# Patient Record
Sex: Female | Born: 1985 | Race: Black or African American | Hispanic: No | Marital: Single | Smoking: Never smoker
Health system: Southern US, Community
[De-identification: ages and names within clinical notes are randomized; demographics above are authoritative.]

## PROBLEM LIST (undated history)

## (undated) DIAGNOSIS — J45909 Unspecified asthma, uncomplicated: Secondary | ICD-10-CM

## (undated) DIAGNOSIS — E119 Type 2 diabetes mellitus without complications: Secondary | ICD-10-CM

## (undated) DIAGNOSIS — N390 Urinary tract infection, site not specified: Secondary | ICD-10-CM

## (undated) DIAGNOSIS — D649 Anemia, unspecified: Secondary | ICD-10-CM

## (undated) HISTORY — DX: Urinary tract infection, site not specified: N39.0

## (undated) HISTORY — DX: Type 2 diabetes mellitus without complications: E11.9

## (undated) HISTORY — PX: MOUTH SURGERY: SHX715

## (undated) HISTORY — DX: Unspecified asthma, uncomplicated: J45.909

## (undated) HISTORY — PX: TONSILLECTOMY: SUR1361

---

## 2013-07-22 HISTORY — PX: BREAST BIOPSY: SHX20

## 2017-10-09 ENCOUNTER — Encounter: Payer: Self-pay | Admitting: Nurse Practitioner

## 2018-04-22 ENCOUNTER — Ambulatory Visit (INDEPENDENT_AMBULATORY_CARE_PROVIDER_SITE_OTHER): Payer: PRIVATE HEALTH INSURANCE | Admitting: Nurse Practitioner

## 2018-04-22 ENCOUNTER — Encounter: Payer: Self-pay | Admitting: Nurse Practitioner

## 2018-04-22 VITALS — BP 126/90 | HR 88 | Temp 98.2°F | Ht 65.25 in | Wt 173.6 lb

## 2018-04-22 DIAGNOSIS — D250 Submucous leiomyoma of uterus: Secondary | ICD-10-CM

## 2018-04-22 DIAGNOSIS — R1904 Left lower quadrant abdominal swelling, mass and lump: Secondary | ICD-10-CM

## 2018-04-22 DIAGNOSIS — E119 Type 2 diabetes mellitus without complications: Secondary | ICD-10-CM | POA: Diagnosis not present

## 2018-04-22 DIAGNOSIS — D251 Intramural leiomyoma of uterus: Secondary | ICD-10-CM | POA: Diagnosis not present

## 2018-04-22 DIAGNOSIS — N6320 Unspecified lump in the left breast, unspecified quadrant: Secondary | ICD-10-CM

## 2018-04-22 DIAGNOSIS — N926 Irregular menstruation, unspecified: Secondary | ICD-10-CM

## 2018-04-22 LAB — COMPREHENSIVE METABOLIC PANEL
ALK PHOS: 39 U/L (ref 39–117)
ALT: 9 U/L (ref 0–35)
AST: 12 U/L (ref 0–37)
Albumin: 4.2 g/dL (ref 3.5–5.2)
BUN: 10 mg/dL (ref 6–23)
CO2: 25 meq/L (ref 19–32)
Calcium: 9.6 mg/dL (ref 8.4–10.5)
Chloride: 103 mEq/L (ref 96–112)
Creatinine, Ser: 0.64 mg/dL (ref 0.40–1.20)
GFR: 139.19 mL/min (ref >60.00)
GLUCOSE: 188 mg/dL — AB (ref 70–99)
POTASSIUM: 4 meq/L (ref 3.5–5.1)
SODIUM: 136 meq/L (ref 135–145)
TOTAL PROTEIN: 8 g/dL (ref 6.0–8.3)
Total Bilirubin: 0.4 mg/dL (ref 0.2–1.2)

## 2018-04-22 LAB — CBC
HCT: 39.8 % (ref 36.0–46.0)
Hemoglobin: 13 g/dL (ref 12.0–15.0)
MCHC: 32.7 g/dL (ref 30.0–36.0)
MCV: 74.3 fl — ABNORMAL LOW (ref 78.0–100.0)
PLATELETS: 270 10*3/uL (ref 150.0–400.0)
RBC: 5.36 Mil/uL — ABNORMAL HIGH (ref 3.87–5.11)
RDW: 15 % (ref 11.5–15.5)
WBC: 4.9 10*3/uL (ref 4.0–10.5)

## 2018-04-22 LAB — TSH: TSH: 1.46 u[IU]/mL (ref 0.35–4.50)

## 2018-04-22 LAB — MICROALBUMIN / CREATININE URINE RATIO
CREATININE, U: 38.5 mg/dL
Microalb Creat Ratio: 1.8 mg/g (ref 0.0–30.0)

## 2018-04-22 LAB — HEMOGLOBIN A1C: HEMOGLOBIN A1C: 9.2 % — AB (ref 4.6–6.5)

## 2018-04-22 LAB — POCT URINE PREGNANCY: Preg Test, Ur: NEGATIVE

## 2018-04-22 NOTE — Progress Notes (Signed)
Subjective:  Patient ID: Tricia Benitez, female    DOB: 1986-04-08  Age: 32 y.o. MRN: [id number]  CC: Establish Care (est care/lumps on abd, discomfort, going on 1 year/)  No PCP since 2009, lost insurance coverage No previous GYN: hx of abnormal pap  Abdominal Pain  This is a chronic problem. The current episode started more than 1 year ago. The onset quality is gradual. The problem occurs constantly. The problem has been gradually worsening. The pain is located in the suprapubic region, LLQ and RLQ. The quality of the pain is aching, dull and a sensation of fullness. The abdominal pain does not radiate. Pertinent negatives include no anorexia, arthralgias, belching, constipation, diarrhea, dysuria, fever, flatus, frequency, hematochezia, hematuria, melena, myalgias, nausea, vomiting or weight loss. The pain is aggravated by palpation and certain positions. The pain is relieved by nothing. She has tried nothing for the symptoms. There is no history of abdominal surgery, colon cancer, Crohn's disease, gallstones, GERD, irritable bowel syndrome, pancreatitis, PUD or ulcerative colitis.  reports irregular menstrual cycle, but not heavy. FHx of uterine fibroids (mother) Sexually active with female partner only.  DM: Diagnosed 2009. Unable to tolerate metformin, so has not been taking. Has made changes to diet (low carb) Does not remember last hgbA1c Home glucose:160-180 (fasting).  Reviewed past Medical, Social and Family history today.  No outpatient medications prior to visit.   No facility-administered medications prior to visit.    ROS See HPI  Objective:  BP 126/90   Pulse 88   Temp 98.2 F (36.8 C) (Oral)   Ht 5' 5.25" (1.657 m)   Wt 173 lb 9.6 oz (78.7 kg)   LMP 03/23/2018 (Within Days)   SpO2 100%   BMI 28.67 kg/m   BP Readings from Last 3 Encounters:  04/22/18 126/90    Wt Readings from Last 3 Encounters:  04/22/18 173 lb 9.6 oz (78.7 kg)    Physical Exam    Constitutional: She is oriented to person, place, and time. She appears well-developed and well-nourished.  Neck: Normal range of motion. Neck supple. No thyromegaly present.  Cardiovascular: Normal rate.  Pulmonary/Chest: Effort normal.  Abdominal: She exhibits distension and mass. There is no tenderness. There is no rebound and no guarding. No hernia.    Musculoskeletal: Normal range of motion. She exhibits no edema.  Neurological: She is alert and oriented to person, place, and time.  Psychiatric: She has a normal mood and affect. Her behavior is normal. Thought content normal.  Vitals reviewed.   Lab Results  Component Value Date   WBC 4.9 04/22/2018   HGB 13.0 04/22/2018   HCT 39.8 04/22/2018   PLT 270.0 04/22/2018   GLUCOSE 188 (H) 04/22/2018   ALT 9 04/22/2018   AST 12 04/22/2018   NA 136 04/22/2018   K 4.0 04/22/2018   CL 103 04/22/2018   CREATININE 0.64 04/22/2018   BUN 10 04/22/2018   CO2 25 04/22/2018   TSH 1.46 04/22/2018   HGBA1C 9.2 (H) 04/22/2018   MICROALBUR <0.7 04/22/2018     Assessment & Plan:   Aniesha was seen today for establish care.  Diagnoses and all orders for this visit:  Intramural and submucous leiomyoma of uterus -     CT Abdomen Pelvis W Contrast; Future -     Ambulatory referral to Gynecology  Abdominal mass, left lower quadrant  Type 2 diabetes mellitus without complication, without long-term current use of insulin (HCC) -     Hemoglobin A1c -  Comprehensive metabolic panel -     Microalbumin / creatinine urine ratio -     metFORMIN (GLUCOPHAGE-XR) 750 MG 24 hr tablet; Take 1 tablet (750 mg total) by mouth daily with breakfast. -     sitaGLIPtin (JANUVIA) 50 MG tablet; Take 1 tablet (50 mg total) by mouth daily.  Irregular menstrual cycle -     TSH -     POCT urine pregnancy -     CBC -     Ambulatory referral to Gynecology  Left breast mass   I am having Tricia Benitez start on metFORMIN and sitaGLIPtin.  Meds  ordered this encounter  Medications  . metFORMIN (GLUCOPHAGE-XR) 750 MG 24 hr tablet    Sig: Take 1 tablet (750 mg total) by mouth daily with breakfast.    Dispense:  90 tablet    Refill:  1    Order Specific Question:   Supervising Provider    Answer:   Lucille Passy [[id number]]  . sitaGLIPtin (JANUVIA) 50 MG tablet    Sig: Take 1 tablet (50 mg total) by mouth daily.    Dispense:  30 tablet    Refill:  5    Order Specific Question:   Supervising Provider    Answer:   Lucille Passy [[id number]]    Follow-up: Return in about 3 months (around 07/23/2018) for CPE (fasting).  Wilfred Lacy, NP

## 2018-04-22 NOTE — Patient Instructions (Addendum)
Notified Tricia Benitez about lab results and CT ABD/pelvis report: Uncontrolled DM with HgbA1c of 9.2 and normal urine microalbumin. Metformin and januvia sent. New rx sent. CBC indicates mild anemia, may use prenatal vitamin once a day. Normal Thyroid function. CT ABD/pelvis indicates large uterine fibroid. Urgent referral to GYN entered. CT also found left breast mass, and recommended for additional imaging. Tricia Benitez stated she had breast biopsy done 2015 and pathology was benign. She performs frequent self breast exam and has not noticed a change.

## 2018-04-23 ENCOUNTER — Ambulatory Visit (INDEPENDENT_AMBULATORY_CARE_PROVIDER_SITE_OTHER)
Admission: RE | Admit: 2018-04-23 | Discharge: 2018-04-23 | Disposition: A | Discharge status: Home / Self Care | Payer: PRIVATE HEALTH INSURANCE | Source: Ambulatory Visit | Attending: Nurse Practitioner | Admitting: Nurse Practitioner

## 2018-04-23 ENCOUNTER — Ambulatory Visit: Payer: Self-pay | Admitting: Primary Care

## 2018-04-23 ENCOUNTER — Encounter: Payer: Self-pay | Admitting: Nurse Practitioner

## 2018-04-23 DIAGNOSIS — R1900 Intra-abdominal and pelvic swelling, mass and lump, unspecified site: Secondary | ICD-10-CM

## 2018-04-23 DIAGNOSIS — N6320 Unspecified lump in the left breast, unspecified quadrant: Secondary | ICD-10-CM | POA: Insufficient documentation

## 2018-04-23 DIAGNOSIS — D250 Submucous leiomyoma of uterus: Secondary | ICD-10-CM | POA: Insufficient documentation

## 2018-04-23 DIAGNOSIS — D251 Intramural leiomyoma of uterus: Principal | ICD-10-CM

## 2018-04-23 MED ORDER — METFORMIN HCL ER 750 MG PO TB24: 750.0000 mg | ORAL_TABLET | Freq: Every day | ORAL | 1 refills | Status: DC

## 2018-04-23 MED ORDER — IOPAMIDOL (ISOVUE-300) INJECTION 61%
100.0000 mL | Freq: Once | INTRAVENOUS | Status: AC | PRN
  Administered 2018-04-23: 100 mL via INTRAVENOUS

## 2018-04-23 MED ORDER — SITAGLIPTIN PHOSPHATE 50 MG PO TABS: 50.0000 mg | ORAL_TABLET | Freq: Every day | ORAL | 5 refills | Status: DC

## 2018-04-30 ENCOUNTER — Inpatient Hospital Stay: Admission: RE | Admit: 2018-04-30 | Payer: PRIVATE HEALTH INSURANCE | Source: Ambulatory Visit

## 2018-05-04 ENCOUNTER — Encounter: Payer: Self-pay | Admitting: Nurse Practitioner

## 2018-05-04 ENCOUNTER — Telehealth: Payer: Self-pay | Admitting: Nurse Practitioner

## 2018-05-04 DIAGNOSIS — E559 Vitamin D deficiency, unspecified: Secondary | ICD-10-CM | POA: Insufficient documentation

## 2018-05-04 MED ORDER — VITAMIN D (ERGOCALCIFEROL) 1.25 MG (50000 UNIT) PO CAPS: 50000.0000 [IU] | ORAL_CAPSULE | ORAL | 0 refills | Status: DC

## 2018-05-04 NOTE — Telephone Encounter (Signed)
Pt is aware.  

## 2018-05-04 NOTE — Telephone Encounter (Signed)
Reviewed labs done by [facility].10/09/2017. Vitamin D level of 19.7. Sent 50000IU supplement x 6weeks then switch to 1000IU OTC once a day.

## 2018-05-06 ENCOUNTER — Other Ambulatory Visit (HOSPITAL_COMMUNITY)
Admission: RE | Admit: 2018-05-06 | Discharge: 2018-05-06 | Disposition: A | Discharge status: Home / Self Care | Payer: No Typology Code available for payment source | Source: Ambulatory Visit | Attending: Obstetrics and Gynecology | Admitting: Obstetrics and Gynecology

## 2018-05-06 ENCOUNTER — Encounter: Payer: Self-pay | Admitting: Obstetrics and Gynecology

## 2018-05-06 ENCOUNTER — Ambulatory Visit (INDEPENDENT_AMBULATORY_CARE_PROVIDER_SITE_OTHER): Payer: PRIVATE HEALTH INSURANCE | Admitting: Obstetrics and Gynecology

## 2018-05-06 VITALS — BP 136/90 | HR 83 | Ht 65.0 in | Wt 179.0 lb

## 2018-05-06 DIAGNOSIS — N938 Other specified abnormal uterine and vaginal bleeding: Secondary | ICD-10-CM | POA: Insufficient documentation

## 2018-05-06 DIAGNOSIS — D250 Submucous leiomyoma of uterus: Secondary | ICD-10-CM

## 2018-05-06 DIAGNOSIS — Z1151 Encounter for screening for human papillomavirus (HPV): Secondary | ICD-10-CM

## 2018-05-06 DIAGNOSIS — Z124 Encounter for screening for malignant neoplasm of cervix: Secondary | ICD-10-CM

## 2018-05-06 DIAGNOSIS — D251 Intramural leiomyoma of uterus: Secondary | ICD-10-CM

## 2018-05-06 NOTE — Patient Instructions (Signed)
Myomectomy Myomectomy is surgery to remove a noncancerous tumor (myoma) from the uterus. Myomas are tumors made up of fibrous tissue. They are often called fibroid tumors. Fibroid tumors can range from the size of a pea to the size of a grapefruit. In a myomectomy, the fibroid tumor is removed without removing the uterus. Because these tumors are rarely cancerous, this surgery is usually done only if the tumor is growing or causing symptoms such as pain, pressure, bleeding, or pain with intercourse. LET YOUR HEALTH CARE PROVIDER KNOW ABOUT:  Any allergies you have.  All medicines you are taking, including vitamins, herbs, eye drops, creams, and over-the-counter medicines.  Previous problems you or members of your family have had with the use of anesthetics.  Any blood disorders you have.  Previous surgeries you have had.  Medical conditions you have. RISKS AND COMPLICATIONS Generally, this is a safe procedure. However, as with any procedure, complications can occur. Possible complications include:  Excessive bleeding.  Infection.  Injury to nearby organs.  Blood clots in the legs, chest, or brain.  Scar tissue on other organs and in the pelvis. This may require another surgery to remove the scar tissue.  BEFORE THE PROCEDURE  Ask your health care provider about changing or stopping your regular medicines. Avoid taking aspirin or blood thinners as directed by your health care provider.  Do not  eat or drink anything after midnight on the night before surgery.  If you smoke, do not  smoke for 2 weeks before the surgery.  Do not  drink alcohol the day before the surgery.  Arrange for someone to drive you home after the procedure or after your hospital stay. Also arrange for someone to help you with activities during your recovery. PROCEDURE You will be given medicine to make you sleep through the procedure (general anesthetic). Any of the following methods may be used to perform  a myomectomy:  Small monitors will be put on your body. They are used to check your heart, blood pressure, and oxygen level.  An IV access tube will be put into one of your veins. Medicine will be able to flow directly into your body through this IV tube.  You might be given a medicine to help you relax (sedative).  You will be given a medicine to make you sleep (general anesthetic). A breathing tube will be placed into your lungs during the procedure.  A thin, flexible tube (catheter) will be inserted into your bladder to collect urine.  Any of the following methods may be used to perform a myomectomy: ? Hysteroscopic myomectomy-This method may be used when the fibroid tumor is inside the cavity of the uterus. A long, thin tube that is like a telescope (hysteroscope) is inserted inside the uterus. A saline solution is put into your uterus. This expands the uterus and allows the surgeon to see the fibroids. Tools are passed through the hysteroscope to remove the fibroid tumor in pieces. ? Laparoscopic myomectomy-A few small cuts (incisions) are made in the lower abdomen. A thin, lighted tube with a tiny camera on the end (laparoscope) is inserted through one of the incisions. This gives the surgeon a good view of the area. The fibroid tumor is removed through the other incisions. The incisions are then closed with stitches (sutures) or staples. ? Abdominal myomectomy-This method is used when the fibroid tumor cannot be removed with a hysteroscope or laparoscope. The surgery is performed through a larger surgical incision in the abdomen. The   fibroid tumor is removed through this incision. The incision is closed with sutures or staples.  What to expect after the procedure  If you had a laparoscopic or hysteroscopic myomectomy, you may be able to go home the same day, or you may need to stay in the hospital overnight.  If you had an abdominal myomectomy, you may need to stay in the hospital for a  few days.  Your IV access tube and catheter will be removed in 1-2 days.  You may be given medicine for pain or to help you sleep.  You may be given an antibiotic medicine, if needed. This information is not intended to replace advice given to you by your health care provider. Make sure you discuss any questions you have with your health care provider. Document Released: 05/05/2007 Document Revised: 12/14/2015 Document Reviewed: 02/17/2013 Elsevier Interactive Patient Education  2017 [employer]. Hysterectomy Information A hysterectomy is a surgery to remove your uterus. After surgery, you will no longer have periods. Also, you will not be able to get pregnant. Reasons for this surgery  You have bleeding that is not normal and keeps coming back.  You have lasting (chronic) lower belly (pelvic) pain.  You have a lasting infection.  The lining of your uterus grows outside your uterus.  The lining of your uterus grows in the muscle of your uterus.  Your uterus falls down into your vagina.  You have a growth in your uterus that causes problems.  You have cells that could turn into cancer (precancerous cells).  You have cancer of the uterus or cervix. Types There are 3 types of hysterectomies. Depending on the type, the surgery will:  Remove the top part of the uterus only.  Remove the uterus and the cervix.  Remove the uterus, cervix, and tissue that holds the uterus in place in the lower belly.  Ways a hysterectomy can be performed There are 5 ways this surgery can be performed.  A cut (incision) is made in the belly (abdomen). The uterus is taken out through the cut.  A cut is made in the vagina. The uterus is taken out through the cut.  Three or four cuts are made in the belly. A surgical device with a camera is put through one of the cuts. The uterus is cut into small pieces. The uterus is taken out through the cuts or the vagina.  Three or four cuts are made in the  belly. A surgical device with a camera is put through one of the cuts. The uterus is taken out through the vagina.  Three or four cuts are made in the belly. A surgical device that is controlled by a computer makes a visual image. The device helps the surgeon control the surgical tools. The uterus is cut into small pieces. The pieces are taken out through the cuts or through the vagina.  What can I expect after the surgery?  You will be given pain medicine.  You will need help at home for 3-5 days after surgery.  You will need to see your doctor in 2-4 weeks after surgery.  You may get hot flashes, have night sweats, and have trouble sleeping.  You may need to have Pap tests in the future if your surgery was related to cancer. Talk to your doctor. It is still good to have regular exams. This information is not intended to replace advice given to you by your health care provider. Make sure you discuss any questions  you have with your health care provider. Document Released: 09/30/2011 Document Revised: 12/14/2015 Document Reviewed: 03/15/2013 Elsevier Interactive Patient Education  2018 [employer]. Endometrial Biopsy, Care After This sheet gives you information about how to care for yourself after your procedure. Your health care provider may also give you more specific instructions. If you have problems or questions, contact your health care provider. What can I expect after the procedure? After the procedure, it is common to have:  Mild cramping.  A small amount of vaginal bleeding for a few days. This is normal.  Follow these instructions at home:  Take over-the-counter and prescription medicines only as told by your health care provider.  Do not douche, use tampons, or have sexual intercourse until your health care provider approves.  Return to your normal activities as told by your health care provider. Ask your health care provider what activities are safe for you.  Follow  instructions from your health care provider about any activity restrictions, such as restrictions on strenuous exercise or heavy lifting. Contact a health care provider if:  You have heavy bleeding, or bleed for longer than 2 days after the procedure.  You have bad smelling discharge from your vagina.  You have a fever or chills.  You have a burning sensation when urinating or you have difficulty urinating.  You have severe pain in your lower abdomen. Get help right away if:  You have severe cramps in your stomach or back.  You pass large blood clots.  Your bleeding increases.  You become weak or light-headed, or you pass out. Summary  After the procedure, it is common to have mild cramping and a small amount of vaginal bleeding for a few days.  Do not douche, use tampons, or have sexual intercourse until your health care provider approves.  Return to your normal activities as told by your health care provider. Ask your health care provider what activities are safe for you. This information is not intended to replace advice given to you by your health care provider. Make sure you discuss any questions you have with your health care provider. Document Released: 04/28/2013 Document Revised: 07/24/2016 Document Reviewed: 07/24/2016 Elsevier Interactive Patient Education  2017 [employer].

## 2018-05-06 NOTE — Progress Notes (Signed)
Ms Roskelley presents for evaluation of uterine fibroids noted on CT scan earlier this week.  CT scan note 3 large fibroids 9, 10, and 14 cm Pt with pain last several weeks Irregular cycles last 6 months, prior regular Cycles last 5 days, heavy with cramps and clots Pap smear several years ago abnormal SAB x 1 DM Same sex relationship H/O bengin breast Bx in the past  PE AF VSS Lungs clear Heart RRR Abd soft pelvic/ abd mass effect noted irregular contour, large mass RUQ and LLQ GU nl EGBUS cervix no lesions pap smear obtained, uterus as noted above, unable to evaluate adnexal d/t uterine size  ENDOMETRIAL BIOPSY     The indications for endometrial biopsy were reviewed.   Risks of the biopsy including cramping, bleeding, infection, uterine perforation, inadequate specimen and need for additional procedures  were discussed. The patient states she understands and agrees to undergo procedure today. Consent was signed. Time out was performed. Urine HCG was negative. During the pelvic exam, the cervix was prepped with Betadine. A single-toothed tenaculum was placed on the anterior lip of the cervix to stabilize it. The 3 mm pipelle was introduced into the endometrial cavity without difficulty to a depth of 12 cm, and a moderate amount of tissue was obtained and sent to pathology. The instruments were removed from the patient's vagina. Minimal bleeding from the cervix was noted. The patient tolerated the procedure well. Routine post-procedure instructions were given to the patient.    A/P DUB        Enlarged uterus with uterine fibroids        Breast mass  Pt reports PCP following breast mass. Encourage to complete f/u. EMBX completed today Uterine fibroids reviewed with pt. Tx options discussed. Pt leaning to hyst. Information on hyst and myomectomy provided to pt. Will contact pt with test results and proceed from there

## 2018-05-08 LAB — CYTOLOGY - PAP
DIAGNOSIS: NEGATIVE
HPV (WINDOPATH): NOT DETECTED

## 2018-07-07 ENCOUNTER — Other Ambulatory Visit: Payer: Self-pay

## 2018-07-07 DIAGNOSIS — D251 Intramural leiomyoma of uterus: Principal | ICD-10-CM

## 2018-07-07 DIAGNOSIS — D250 Submucous leiomyoma of uterus: Secondary | ICD-10-CM

## 2018-07-08 ENCOUNTER — Encounter (HOSPITAL_COMMUNITY): Payer: Self-pay

## 2018-07-14 ENCOUNTER — Ambulatory Visit (HOSPITAL_COMMUNITY)
Admission: RE | Admit: 2018-07-14 | Discharge: 2018-07-14 | Disposition: A | Discharge status: Home / Self Care | Payer: No Typology Code available for payment source | Source: Ambulatory Visit | Attending: Obstetrics and Gynecology | Admitting: Obstetrics and Gynecology

## 2018-07-14 DIAGNOSIS — N852 Hypertrophy of uterus: Secondary | ICD-10-CM | POA: Insufficient documentation

## 2018-07-14 DIAGNOSIS — D251 Intramural leiomyoma of uterus: Secondary | ICD-10-CM | POA: Insufficient documentation

## 2018-07-14 DIAGNOSIS — D250 Submucous leiomyoma of uterus: Secondary | ICD-10-CM

## 2018-07-21 ENCOUNTER — Telehealth: Payer: Self-pay

## 2018-07-21 NOTE — Telephone Encounter (Signed)
Contacted pt and advised scheduler will call for appt to discuss surgery, pt agreed.

## 2018-07-24 ENCOUNTER — Ambulatory Visit (INDEPENDENT_AMBULATORY_CARE_PROVIDER_SITE_OTHER): Payer: PRIVATE HEALTH INSURANCE | Admitting: Nurse Practitioner

## 2018-07-24 ENCOUNTER — Encounter: Payer: Self-pay | Admitting: Nurse Practitioner

## 2018-07-24 VITALS — BP 138/94 | HR 92 | Temp 98.8°F | Ht 65.0 in | Wt 173.8 lb

## 2018-07-24 DIAGNOSIS — Z114 Encounter for screening for human immunodeficiency virus [HIV]: Secondary | ICD-10-CM | POA: Diagnosis not present

## 2018-07-24 DIAGNOSIS — Z13220 Encounter for screening for lipoid disorders: Secondary | ICD-10-CM | POA: Diagnosis not present

## 2018-07-24 DIAGNOSIS — E119 Type 2 diabetes mellitus without complications: Secondary | ICD-10-CM

## 2018-07-24 DIAGNOSIS — Z0001 Encounter for general adult medical examination with abnormal findings: Secondary | ICD-10-CM | POA: Diagnosis not present

## 2018-07-24 DIAGNOSIS — N6320 Unspecified lump in the left breast, unspecified quadrant: Secondary | ICD-10-CM | POA: Diagnosis not present

## 2018-07-24 DIAGNOSIS — E559 Vitamin D deficiency, unspecified: Secondary | ICD-10-CM | POA: Diagnosis not present

## 2018-07-24 DIAGNOSIS — D500 Iron deficiency anemia secondary to blood loss (chronic): Secondary | ICD-10-CM | POA: Diagnosis not present

## 2018-07-24 DIAGNOSIS — Z136 Encounter for screening for cardiovascular disorders: Secondary | ICD-10-CM | POA: Diagnosis not present

## 2018-07-24 LAB — LIPID PANEL
Cholesterol: 130 mg/dL (ref 0–200)
HDL: 39.2 mg/dL (ref >39.00)
LDL Cholesterol: 78 mg/dL (ref 0–99)
NONHDL: 91.1
Total CHOL/HDL Ratio: 3
Triglycerides: 67 mg/dL (ref 0.0–149.0)
VLDL: 13.4 mg/dL (ref 0.0–40.0)

## 2018-07-24 LAB — CBC WITH DIFFERENTIAL/PLATELET
BASOS ABS: 0 10*3/uL (ref 0.0–0.1)
Basophils Relative: 0.9 % (ref 0.0–3.0)
EOS ABS: 0 10*3/uL (ref 0.0–0.7)
Eosinophils Relative: 1 % (ref 0.0–5.0)
HCT: 40.1 % (ref 36.0–46.0)
Hemoglobin: 12.7 g/dL (ref 12.0–15.0)
LYMPHS ABS: 1.7 10*3/uL (ref 0.7–4.0)
Lymphocytes Relative: 34.1 % (ref 12.0–46.0)
MCHC: 31.7 g/dL (ref 30.0–36.0)
MCV: 74.7 fl — AB (ref 78.0–100.0)
MONOS PCT: 6.9 % (ref 3.0–12.0)
Monocytes Absolute: 0.3 10*3/uL (ref 0.1–1.0)
NEUTROS PCT: 57.1 % (ref 43.0–77.0)
Neutro Abs: 2.8 10*3/uL (ref 1.4–7.7)
Platelets: 304 10*3/uL (ref 150.0–400.0)
RBC: 5.36 Mil/uL — AB (ref 3.87–5.11)
RDW: 15.8 % — ABNORMAL HIGH (ref 11.5–15.5)
WBC: 4.9 10*3/uL (ref 4.0–10.5)

## 2018-07-24 LAB — IBC PANEL
Iron: 46 ug/dL (ref 42–145)
Saturation Ratios: 12.8 % — ABNORMAL LOW (ref 20.0–50.0)
TRANSFERRIN: 256 mg/dL (ref 212.0–360.0)

## 2018-07-24 LAB — BASIC METABOLIC PANEL
BUN: 11 mg/dL (ref 6–23)
CHLORIDE: 103 meq/L (ref 96–112)
CO2: 27 meq/L (ref 19–32)
Calcium: 9.8 mg/dL (ref 8.4–10.5)
Creatinine, Ser: 0.6 mg/dL (ref 0.40–1.20)
GFR: 149.71 mL/min (ref >60.00)
Glucose, Bld: 189 mg/dL — ABNORMAL HIGH (ref 70–99)
POTASSIUM: 4.2 meq/L (ref 3.5–5.1)
SODIUM: 139 meq/L (ref 135–145)

## 2018-07-24 LAB — HEMOGLOBIN A1C: HEMOGLOBIN A1C: 8.4 % — AB (ref 4.6–6.5)

## 2018-07-24 MED ORDER — METFORMIN HCL ER 750 MG PO TB24: 750.0000 mg | ORAL_TABLET | Freq: Every day | ORAL | 1 refills | Status: DC

## 2018-07-24 NOTE — Patient Instructions (Addendum)
Normal lipid panel Normal renal function but elevated glucose. Stable cbc and iron panel. HgbA1c of 8.4.  I strongly advise to start metformin. Ok to [place] for now. Return to office in 109month  You will be contacted to schedule appt for mammogram.  Schedule diabetic eye exam annually.  Health Maintenance, Female Adopting a healthy lifestyle and getting preventive care can go a long way to promote health and wellness. Talk with your health care provider about what schedule of regular examinations is right for you. This is a good chance for you to check in with your provider about disease prevention and staying healthy. In between checkups, there are plenty of things you can do on your own. Experts have done a lot of research about which lifestyle changes and preventive measures are most likely to keep you healthy. Ask your health care provider for more information. Weight and diet Eat a healthy diet  Be sure to include plenty of vegetables, fruits, low-fat dairy products, and lean protein.  Do not eat a lot of foods high in solid fats, added sugars, or salt.  Get regular exercise. This is one of the most important things you can do for your health. ? Most adults should exercise for at least 150 minutes each week. The exercise should increase your heart rate and make you sweat (moderate-intensity exercise). ? Most adults should also do strengthening exercises at least twice a week. This is in addition to the moderate-intensity exercise. Maintain a healthy weight  Body mass index (BMI) is a measurement that can be used to identify possible weight problems. It estimates body fat based on height and weight. Your health care provider can help determine your BMI and help you achieve or maintain a healthy weight.  For females 260years of age and older: ? A BMI below 18.5 is considered underweight. ? A BMI of 18.5 to 24.9 is normal. ? A BMI of 25 to 29.9 is considered overweight. ? A  BMI of 30 and above is considered obese. Watch levels of cholesterol and blood lipids  You should start having your blood tested for lipids and cholesterol at 32years of age, then have this test every 5 years.  You may need to have your cholesterol levels checked more often if: ? Your lipid or cholesterol levels are high. ? You are older than 32years of age. ? You are at high risk for heart disease. Cancer screening Lung Cancer  Lung cancer screening is recommended for adults 56824years old who are at high risk for lung cancer because of a history of smoking.  A yearly low-dose CT scan of the lungs is recommended for people who: ? Currently smoke. ? Have quit within the past 15 years. ? Have at least a 30-pack-year history of smoking. A pack year is smoking an average of one pack of cigarettes a day for 1 year.  Yearly screening should continue until it has been 15 years since you quit.  Yearly screening should stop if you develop a health problem that would prevent you from having lung cancer treatment. Breast Cancer  Practice breast self-awareness. This means understanding how your breasts normally appear and feel.  It also means doing regular breast self-exams. Let your health care provider know about any changes, no matter how small.  If you are in your 20s or 30s, you should have a clinical breast exam (CBE) by a health care provider every 1-3 years as part of a regular health  exam.  If you are 40 or older, have a CBE every year. Also consider having a breast X-ray (mammogram) every year.  If you have a family history of breast cancer, talk to your health care provider about genetic screening.  If you are at high risk for breast cancer, talk to your health care provider about having an MRI and a mammogram every year.  Breast cancer gene (BRCA) assessment is recommended for women who have family members with BRCA-related cancers. BRCA-related cancers  include: ? Breast. ? Ovarian. ? Tubal. ? Peritoneal cancers.  Results of the assessment will determine the need for genetic counseling and BRCA1 and BRCA2 testing. Cervical Cancer Your health care provider may recommend that you be screened regularly for cancer of the pelvic organs (ovaries, uterus, and vagina). This screening involves a pelvic examination, including checking for microscopic changes to the surface of your cervix (Pap test). You may be encouraged to have this screening done every 3 years, beginning at age 70.  For women ages 58-65, health care providers may recommend pelvic exams and Pap testing every 3 years, or they may recommend the Pap and pelvic exam, combined with testing for human papilloma virus (HPV), every 5 years. Some types of HPV increase your risk of cervical cancer. Testing for HPV may also be done on women of any age with unclear Pap test results.  Other health care providers may not recommend any screening for nonpregnant women who are considered low risk for pelvic cancer and who do not have symptoms. Ask your health care provider if a screening pelvic exam is right for you.  If you have had past treatment for cervical cancer or a condition that could lead to cancer, you need Pap tests and screening for cancer for at least 20 years after your treatment. If Pap tests have been discontinued, your risk factors (such as having a new sexual partner) need to be reassessed to determine if screening should resume. Some women have medical problems that increase the chance of getting cervical cancer. In these cases, your health care provider may recommend more frequent screening and Pap tests. Colorectal Cancer  This type of cancer can be detected and often prevented.  Routine colorectal cancer screening usually begins at 32 years of age and continues through 32 years of age.  Your health care provider may recommend screening at an earlier age if you have risk factors for  colon cancer.  Your health care provider may also recommend using home test kits to check for hidden blood in the stool.  A small camera at the end of a tube can be used to examine your colon directly (sigmoidoscopy or colonoscopy). This is done to check for the earliest forms of colorectal cancer.  Routine screening usually begins at age 40.  Direct examination of the colon should be repeated every 5-10 years through 32 years of age. However, you may need to be screened more often if early forms of precancerous polyps or small growths are found. Skin Cancer  Check your skin from head to toe regularly.  Tell your health care provider about any new moles or changes in moles, especially if there is a change in a mole's shape or color.  Also tell your health care provider if you have a mole that is larger than the size of a pencil eraser.  Always use sunscreen. Apply sunscreen liberally and repeatedly throughout the day.  Protect yourself by wearing long sleeves, pants, a wide-brimmed hat, and sunglasses  whenever you are outside. Heart disease, diabetes, and high blood pressure  High blood pressure causes heart disease and increases the risk of stroke. High blood pressure is more likely to develop in: ? People who have blood pressure in the high end of the normal range (130-139/85-89 mm Hg). ? People who are overweight or obese. ? People who are African American.  If you are 5-72 years of age, have your blood pressure checked every 3-5 years. If you are 41 years of age or older, have your blood pressure checked every year. You should have your blood pressure measured twice-once when you are at a hospital or clinic, and once when you are not at a hospital or clinic. Record the average of the two measurements. To check your blood pressure when you are not at a hospital or clinic, you can use: ? An automated blood pressure machine at a pharmacy. ? A home blood pressure monitor.  If you are  between 56 years and 26 years old, ask your health care provider if you should take aspirin to prevent strokes.  Have regular diabetes screenings. This involves taking a blood sample to check your fasting blood sugar level. ? If you are at a normal weight and have a low risk for diabetes, have this test once every three years after 32 years of age. ? If you are overweight and have a high risk for diabetes, consider being tested at a younger age or more often. Preventing infection Hepatitis B  If you have a higher risk for hepatitis B, you should be screened for this virus. You are considered at high risk for hepatitis B if: ? You were born in a country where hepatitis B is common. Ask your health care provider which countries are considered high risk. ? Your parents were born in a high-risk country, and you have not been immunized against hepatitis B (hepatitis B vaccine). ? You have HIV or AIDS. ? You use needles to inject street drugs. ? You live with someone who has hepatitis B. ? You have had sex with someone who has hepatitis B. ? You get hemodialysis treatment. ? You take certain medicines for conditions, including cancer, organ transplantation, and autoimmune conditions. Hepatitis C  Blood testing is recommended for: ? Everyone born from [birth year] through 1964. ? Anyone with known risk factors for hepatitis C. Sexually transmitted infections (STIs)  You should be screened for sexually transmitted infections (STIs) including gonorrhea and chlamydia if: ? You are sexually active and are younger than 32 years of age. ? You are older than 32 years of age and your health care provider tells you that you are at risk for this type of infection. ? Your sexual activity has changed since you were last screened and you are at an increased risk for chlamydia or gonorrhea. Ask your health care provider if you are at risk.  If you do not have HIV, but are at risk, it may be recommended that you take  a prescription medicine daily to prevent HIV infection. This is called pre-exposure prophylaxis (PrEP). You are considered at risk if: ? You are sexually active and do not regularly use condoms or know the HIV status of your partner(s). ? You take drugs by injection. ? You are sexually active with a partner who has HIV. Talk with your health care provider about whether you are at high risk of being infected with HIV. If you choose to begin PrEP, you should first be tested for  HIV. You should then be tested every 3 months for as long as you are taking PrEP. Pregnancy  If you are premenopausal and you may become pregnant, ask your health care provider about preconception counseling.  If you may become pregnant, take 400 to 800 micrograms (mcg) of folic acid every day.  If you want to prevent pregnancy, talk to your health care provider about birth control (contraception). Osteoporosis and menopause  Osteoporosis is a disease in which the bones lose minerals and strength with aging. This can result in serious bone fractures. Your risk for osteoporosis can be identified using a bone density scan.  If you are 59 years of age or older, or if you are at risk for osteoporosis and fractures, ask your health care provider if you should be screened.  Ask your health care provider whether you should take a calcium or vitamin D supplement to lower your risk for osteoporosis.  Menopause may have certain physical symptoms and risks.  Hormone replacement therapy may reduce some of these symptoms and risks. Talk to your health care provider about whether hormone replacement therapy is right for you. Follow these instructions at home:  Schedule regular health, dental, and eye exams.  Stay current with your immunizations.  Do not use any tobacco products including cigarettes, chewing tobacco, or electronic cigarettes.  If you are pregnant, do not drink alcohol.  If you are breastfeeding, limit how  much and how often you drink alcohol.  Limit alcohol intake to no more than 1 drink per day for nonpregnant women. One drink equals 12 ounces of beer, 5 ounces of wine, or 1 ounces of hard liquor.  Do not use street drugs.  Do not share needles.  Ask your health care provider for help if you need support or information about quitting drugs.  Tell your health care provider if you often feel depressed.  Tell your health care provider if you have ever been abused or do not feel safe at home. This information is not intended to replace advice given to you by your health care provider. Make sure you discuss any questions you have with your health care provider. Document Released: 01/21/2011 Document Revised: 12/14/2015 Document Reviewed: 04/11/2015 Elsevier Interactive Patient Education  2019 [employer].

## 2018-07-24 NOTE — Progress Notes (Signed)
Subjective:    Patient ID: Tricia Benitez, female    DOB: 06-18-1986, 33 y.o.   MRN: [id number]  Patient presents today for complete physical   HPI DM: Not taking januvia or metformin Home glucose reading: 180-120. Needs diabetic eye exam. Denies any neuropathy.  Uterine Fibroids: scheduled for surgery 02/20120.   Sexual History (orientation,birth control, marital status, STD):single, sexually active  Depression/Suicide: Depression screen [facility] 2/9 04/22/2018  Decreased Interest 0  Down, Depressed, Hopeless 0  PHQ - 2 Score 0   No flowsheet data found.  Vision:will schedule.  Dental:will schedule.  Immunizations: (TDAP, Hep C screen, Pneumovax, Influenza, zoster)  Health Maintenance  Topic Date Due  . HIV Screening  04/21/2002  . Flu Shot  10/20/2018*  . Tetanus Vaccine  07/25/2019*  . Urine Protein Check  04/23/2019  . Pap Smear  05/06/2021  *Topic was postponed. The date shown is not the original due date.   Diet:low carb and low sugar.  Weight:  Wt Readings from Last 3 Encounters:  07/24/18 173 lb 12.8 oz (78.8 kg)  05/06/18 179 lb (81.2 kg)  04/22/18 173 lb 9.6 oz (78.7 kg)   Exercise:none  Fall Risk: Fall Risk  04/22/2018  Falls in the past year? No    Medications and allergies reviewed with patient and updated if appropriate.  Patient Active Problem List   Diagnosis Date Noted  . DUB (dysfunctional uterine bleeding) 05/06/2018  . Vitamin D deficiency 05/04/2018  . Left breast mass 04/23/2018  . Intramural and submucous leiomyoma of uterus 04/23/2018    Current Outpatient Medications on File Prior to Visit  Medication Sig Dispense Refill  . Multiple Vitamin (MULTIVITAMIN) capsule Take 1 capsule by mouth daily.     No current facility-administered medications on file prior to visit.     Past Medical History:  Diagnosis Date  . Asthma   . Diabetes mellitus without complication ([place])   . UTI (urinary tract infection)     Past Surgical  History:  Procedure Laterality Date  . BREAST BIOPSY Left 2015   cyst removed.   . TONSILLECTOMY      Social History   Socioeconomic History  . Marital status: Single    Spouse name: Not on file  . Number of children: Not on file  . Years of education: Not on file  . Highest education level: Not on file  Occupational History  . Not on file  Social Needs  . Financial resource strain: Not on file  . Food insecurity:    Worry: Not on file    Inability: Not on file  . Transportation needs:    Medical: Not on file    Non-medical: Not on file  Tobacco Use  . Smoking status: Never Smoker  . Smokeless tobacco: Never Used  Substance and Sexual Activity  . Alcohol use: Yes    Comment: social  . Drug use: Never  . Sexual activity: Yes    Comment: female partner  Lifestyle  . Physical activity:    Days per week: Not on file    Minutes per session: Not on file  . Stress: Not on file  Relationships  . Social connections:    Talks on phone: Not on file    Gets together: Not on file    Attends religious service: Not on file    Active member of club or organization: Not on file    Attends meetings of clubs or organizations: Not on file    Relationship  status: Not on file  Other Topics Concern  . Not on file  Social History Narrative  . Not on file    Family History  Problem Relation Age of Onset  . Fibroids Mother   . Asthma Mother   . Diabetes Father   . Kidney disease Father   . Alcohol abuse Father   . Diabetes Paternal Grandmother   . Hyperlipidemia Paternal Grandmother   . Hypertension Paternal Grandmother   . Alcohol abuse Maternal Grandmother         Review of Systems  Constitutional: Negative for fever, malaise/fatigue and weight loss.  HENT: Negative for congestion and sore throat.   Eyes:       Negative for visual changes  Respiratory: Negative for cough and shortness of breath.   Cardiovascular: Negative for chest pain, palpitations and leg  swelling.  Gastrointestinal: Negative for blood in stool, constipation, diarrhea and heartburn.  Genitourinary: Negative for dysuria, frequency and urgency.  Musculoskeletal: Negative for falls, joint pain and myalgias.  Skin: Negative for rash.  Neurological: Negative for dizziness, sensory change and headaches.  Endo/Heme/Allergies: Does not bruise/bleed easily.  Psychiatric/Behavioral: Negative for depression, substance abuse and suicidal ideas. The patient is not nervous/anxious.     Objective:   Vitals:   07/24/18 1022  BP: (!) 138/94  Pulse: 92  Temp: 98.8 F (37.1 C)  SpO2: 99%    Body mass index is 28.92 kg/m.   Physical Examination:  Physical Exam Vitals signs reviewed.  Constitutional:      General: She is not in acute distress.    Appearance: She is well-developed.  HENT:     Right Ear: Tympanic membrane, ear canal and external ear normal.     Left Ear: Tympanic membrane, ear canal and external ear normal.     Nose: Nose normal.     Mouth/Throat:     Pharynx: No oropharyngeal exudate.  Eyes:     Conjunctiva/sclera: Conjunctivae normal.     Pupils: Pupils are equal, round, and reactive to light.  Neck:     Musculoskeletal: Normal range of motion and neck supple.  Cardiovascular:     Rate and Rhythm: Normal rate and regular rhythm.     Pulses: Normal pulses.     Heart sounds: Normal heart sounds.  Pulmonary:     Effort: Pulmonary effort is normal. No respiratory distress.     Breath sounds: Normal breath sounds.  Chest:     Chest wall: No tenderness.     Breasts: Breasts are symmetrical.        Right: Normal.        Left: Mass present. No inverted nipple, nipple discharge or tenderness.    Abdominal:     General: Bowel sounds are normal. There is no distension.     Palpations: There is mass.     Tenderness: There is no abdominal tenderness.  Genitourinary:    Comments: Deferred pelvic exam to GYN Musculoskeletal: Normal range of motion.    Lymphadenopathy:     Cervical: No cervical adenopathy.     Upper Body:     Right upper body: No supraclavicular, axillary or pectoral adenopathy.     Left upper body: No supraclavicular, axillary or pectoral adenopathy.  Neurological:     Mental Status: She is alert and oriented to person, place, and time.     Deep Tendon Reflexes: Reflexes are normal and symmetric.  Psychiatric:        Mood and Affect: Mood normal.  Behavior: Behavior normal.        Thought Content: Thought content normal.    ASSESSMENT and PLAN:  Tricia Benitez was seen today for annual exam.  Diagnoses and all orders for this visit:  Encounter for preventative adult health care exam with abnormal findings -     HIV Antibody (routine testing w rflx) -     Lipid panel  Type 2 diabetes mellitus without complication, without long-term current use of insulin (HCC) -     Hemoglobin A1c -     Basic metabolic panel -     metFORMIN (GLUCOPHAGE-XR) 750 MG 24 hr tablet; Take 1 tablet (750 mg total) by mouth daily with breakfast.  Vitamin D deficiency -     Vitamin D 1,25 dihydroxy  Iron deficiency anemia due to chronic blood loss -     CBC w/Diff -     Basic metabolic panel -     IBC panel  Encounter for lipid screening for cardiovascular disease -     Lipid panel  Encounter for screening for human immunodeficiency virus (HIV) -     HIV Antibody (routine testing w rflx)  Left breast mass -     US BREAST LTD UNI LEFT INC AXILLA; Future -     MM Digital Diagnostic Unilat L; Future -     MM Digital Diagnostic Bilat; Future    No problem-specific Assessment & Plan notes found for this encounter.      Problem List Items Addressed This Visit      Other   Left breast mass   Relevant Orders   US BREAST LTD UNI LEFT INC AXILLA   MM Digital Diagnostic Unilat L   MM Digital Diagnostic Bilat   Vitamin D deficiency   Relevant Orders   Vitamin D 1,25 dihydroxy    Other Visit Diagnoses    Encounter for  preventative adult health care exam with abnormal findings    -  Primary   Relevant Orders   HIV Antibody (routine testing w rflx)   Lipid panel (Completed)   Type 2 diabetes mellitus without complication, without long-term current use of insulin (HCC)       Relevant Medications   metFORMIN (GLUCOPHAGE-XR) 750 MG 24 hr tablet   Other Relevant Orders   Hemoglobin A1c (Completed)   Basic metabolic panel (Completed)   Iron deficiency anemia due to chronic blood loss       Relevant Orders   CBC w/Diff (Completed)   Basic metabolic panel (Completed)   IBC panel (Completed)   Encounter for lipid screening for cardiovascular disease       Relevant Orders   Lipid panel (Completed)   Encounter for screening for human immunodeficiency virus (HIV)       Relevant Orders   HIV Antibody (routine testing w rflx)       Follow up: Return in about 3 months (around 10/23/2018) for DM.  Wilfred Lacy, NP

## 2018-07-27 LAB — VITAMIN D 1,25 DIHYDROXY
VITAMIN D 1, 25 (OH) TOTAL: 57 pg/mL (ref 18–72)
VITAMIN D3 1, 25 (OH): 57 pg/mL
Vitamin D2 1, 25 (OH)2: <8 pg/mL

## 2018-07-27 LAB — HIV ANTIBODY (ROUTINE TESTING W REFLEX): HIV 1&2 Ab, 4th Generation: NONREACTIVE

## 2018-08-05 ENCOUNTER — Other Ambulatory Visit: Payer: PRIVATE HEALTH INSURANCE

## 2018-08-14 NOTE — Patient Instructions (Addendum)
Your procedure is scheduled on: Tuesday, 09/01/2018  Enter through the Main Entrance of [facility] at: 1:15 pm  Pick up the phone at the desk and dial [phone].  Call this number if you have problems the morning of surgery: [phone].  Remember: Do NOT eat food after midnight Monday  Do NOT drink clear liquids (including water) after 8:30 am Tuesday, day of surgery  Take these medicines the morning of surgery with a SIP OF WATER:  None  Brush your teeth on the day of surgery.  Bring albuterol inhaler with you on day of surgery.  Stop herbal medications, vitamin supplements, Ibuprofen/NSAIDS 1 week prior to surgery - Tuesday, 2/4.  Do NOT wear jewelry (body piercing), metal hair clips/bobby pins, make-up, or nail polish. Do NOT wear lotions, powders, or perfumes.  You may wear deoderant. Do NOT shave for 48 hours prior to surgery. Do NOT bring valuables to the hospital.  Leave suitcase in car.  After surgery it may be brought to your room.  For patients admitted to the hospital, checkout time is 11:00 AM the day of discharge. Have a responsible adult drive you home and stay with you for 24 hours after your procedure. Home with Girlfriend Sharlot Gowda cell [phone]

## 2018-08-18 ENCOUNTER — Ambulatory Visit (INDEPENDENT_AMBULATORY_CARE_PROVIDER_SITE_OTHER): Payer: PRIVATE HEALTH INSURANCE | Admitting: Obstetrics and Gynecology

## 2018-08-18 ENCOUNTER — Encounter: Payer: Self-pay | Admitting: Obstetrics and Gynecology

## 2018-08-18 VITALS — BP 120/77 | HR 82 | Ht 65.0 in | Wt 177.6 lb

## 2018-08-18 DIAGNOSIS — D250 Submucous leiomyoma of uterus: Secondary | ICD-10-CM | POA: Diagnosis not present

## 2018-08-18 DIAGNOSIS — N938 Other specified abnormal uterine and vaginal bleeding: Secondary | ICD-10-CM | POA: Diagnosis not present

## 2018-08-18 DIAGNOSIS — D251 Intramural leiomyoma of uterus: Secondary | ICD-10-CM

## 2018-08-18 NOTE — Progress Notes (Signed)
Presents for surgical consult for Myomectomy.

## 2018-08-18 NOTE — Patient Instructions (Signed)
Myomectomy, Care After This sheet gives you information about how to care for yourself after your procedure. Your health care provider may also give you more specific instructions. If you have problems or questions, contact your health care provider. What can I expect after the procedure? After the procedure, it is common to have:  Pain in your abdomen, especially at the incision areas. You will be given pain medicine to control the pain.  Tiredness. This is a normal part of the recovery process. Your energy level will return to normal over the coming weeks.  Vaginal bleeding. This is normal and will stop in the coming weeks.  Constipation. Recovery time from this procedure will depend on the type of procedure you had and your general overall health prior to the procedure. Follow these instructions at home: Medicines  Take over-the-counter and prescription medicines only as told by your health care provider.  Do not take aspirin because it can cause bleeding.  If you were prescribed an antibiotic medicine, use it as told by your health care provider. Do not stop using the antibiotic even if you start to feel better.  Do not drive or use heavy machinery while taking prescription pain medicine.  Do not drink alcohol while taking prescription pain medicine. Incision care   Follow instructions from your health care provider about how to take care of any incisions. Make sure you: ? Wash your hands with soap and water before you change your bandage (dressing). If soap and water are not available, use hand sanitizer. ? Change your dressing as told by your health care provider. ? Leave stitches (sutures), skin glue, or adhesive strips in place. These skin closures may need to stay in place for 2 weeks or longer. If adhesive strip edges start to loosen and curl up, you may trim the loose edges. Do not remove adhesive strips completely unless your health care provider tells you to do  that.  Check your incision areas every day for signs of infection. Check for: ? Redness, swelling, or pain. ? Fluid or blood. ? Warmth. ? Pus or a bad smell.  Do not take baths, swim, or use a hot tub until your health care provider approves. Take showers as directed by your health care provider. Activity  Return to your normal activities as told by your health care provider. Ask your health care provider what activities are safe for you.  Do not do activities that require a lot of effort until your health care provider says it is okay.  Do not lift anything that is heavier than 15 lb (6.8 kg) until your health care provider says that it is safe.  Do not douche, use tampons, or have sexual intercourse until your health care provider approves.  Walk daily but take frequent rest breaks if you tire easily.  Continue to practice deep breathing and coughing. If it hurts to cough, try holding a pillow against your belly as you cough.  Do not drive until your health care provider approves. General instructions  To prevent or treat constipation while you are taking prescription pain medicine, your health care provider may recommend that you: ? Drink enough fluid to keep your urine clear or pale yellow. ? Take over-the-counter or prescription medicines. ? Eat foods that are high in fiber, such as fresh fruits and vegetables, whole grains, and beans. ? Limit foods that are high in fat and processed sugars, such as fried and sweet foods.  Take your temperature twice a day  and write it down. If you develop a fever, this may be a sign that you have an infection.  Do not drink alcohol.  Have someone help you at home for 1 week or until you can do your own household activities.  Keep all follow-up visits as told by your health care provider. This is important. Contact a health care provider if:  You have a fever.  You have increasing abdominal pain that is not relieved with  medicine.  You have nausea, vomiting, or diarrhea.  You have pain when you urinate or you have blood in your urine.  You have a rash on your body.  You have pain or redness where your IV access tube was inserted.  You have redness, swelling, or pain around an incision.  You have fluid or blood coming from an incision.  An incision feels warm to the touch.  You have pus or a bad smell coming from an incision. Get help right away if:  You have weakness or light-headedness.  You have pain, swelling, or redness in your legs.  You have chest pain.  You faint.  You have shortness of breath.  You have heavy vaginal bleeding.  You have an incision that is opening up. Summary  Recovery time from this procedure will depend on the type of procedure you had and your general overall health prior to the procedure.  If you were prescribed an antibiotic medicine, use it as told by your health care provider. Do not stop using the antibiotic even if you start to feel better.  Do not douche, use tampons, or have sexual intercourse until your health care provider approves.  Return to your normal activities as told by your health care provider. Ask your health care provider what activities are safe for you. This information is not intended to replace advice given to you by your health care provider. Make sure you discuss any questions you have with your health care provider. Document Released: 11/28/2010 Document Revised: 08/08/2016 Document Reviewed: 08/08/2016 Elsevier Interactive Patient Education  2019 [employer] Myomectomy  Myomectomy is a surgery in which a non-cancerous fibroid (myoma) is removed from the uterus. Myomas are tumors made up of fibrous tissue. They are often called fibroid tumors. Fibroid tumors can range from the size of a pea to the size of a grapefruit. In a myomectomy, the fibroid tumor is removed without removing the uterus. Because these tumors are rarely  cancerous, this surgery is usually done only if the tumor is growing or causing symptoms such as pain, pressure, bleeding, or pain with intercourse. Tell a health care provider about:  Any allergies you have.  All medicines you are taking, including vitamins, herbs, eye drops, creams, and over-the-counter medicines.  Any problems you or family members have had with anesthetic medicines.  Any blood disorders you have.  Any surgeries you have had.  Any medical conditions you have. What are the risks? Generally, this is a safe procedure. However, problems may occur, including:  Bleeding.  Infection.  Allergic reactions to medicines.  Damage to other structures or organs.  Blood clots in the legs, chest, or brain.  Scar tissue on other organs and in the pelvis. This may require another surgery to remove the scar tissue. What happens before the procedure? Staying hydrated Follow instructions from your health care provider about hydration, which may include:  Up to 2 hours before the procedure - you may continue to drink clear liquids, such as water, clear fruit  juice, black coffee, and plain tea. Eating and drinking restrictions Follow instructions from your health care provider about eating and drinking, which may include:  8 hours before the procedure - stop eating heavy meals or foods such as meat, fried foods, or fatty foods.  6 hours before the procedure - stop eating light meals or foods, such as toast or cereal.  6 hours before the procedure - stop drinking milk or drinks that contain milk.  2 hours before the procedure - stop drinking clear liquids. General instructions  Ask your health care provider about: ? Changing or stopping your regular medicines. This is especially important if you are taking diabetes medicines or blood thinners. ? Taking medicines such as aspirin and ibuprofen. These medicines can thin your blood. Do not take these medicines before your  procedure if your health care provider instructs you not to.  Do not drink alcohol the day before the surgery.  Do not use any products that contain nicotine or tobacco, such as cigarettes and e-cigarettes, for 2 weeks before the procedure. If you need help quitting, ask your health care provider.  Plan to have someone take you home from the hospital or clinic. Also arrange for someone to help you with activities during your recovery. What happens during the procedure?  To reduce your risk of infection: ? Your health care team will wash or sanitize their hands. ? Your skin will be washed with soap. ? Hair may be removed from the surgical area.  An IV tube will be inserted into one of your veins. Medicines will be able to flow directly into your body through this IV tube.  You will be given one or more of the following: ? A medicine to help you relax (sedative). ? A medicine to make you fall asleep (general anesthetic).  Small monitors will be attached to your body. They will be used to check your heart, blood pressure, and oxygen level.  A breathing tube will be placed into your lungs during the procedure.  A thin, flexible tube (catheter) will be inserted into your bladder to collect urine.  Your surgeon will use one of the following methods to perform the procedure. The method used will depend on the size, shape, location, and number of fibroids. Hysteroscopic myomectomy This method may be used when the fibroid tumor is inside the cavity of the uterus. A long, thin tube with a lens (hysteroscope) will be inserted into the uterus through the vagina. A saline solution will be put into the uterus. This will expand the uterus and allow the surgeon to see the fibroids. Tools will be passed through the hysteroscope to remove the fibroid tumor in pieces. Laparoscopic myomectomy A few small incisions will be made in the lower abdomen. A thin, lighted tube with a camera (laparoscope) will be  inserted through one of the incisions. This will give the surgeon a good view of the area. The fibroid tumor will be removed through the other incisions. The incisions will then be closed with stitches (sutures) or staples. Abdominal myomectomy This method is used when the fibroid tumor cannot be removed with a hysteroscope or laparoscope. The surgery will be done through a larger surgical incision in the abdomen. The fibroid tumor will be removed through this incision. The incision will be closed with sutures or staples. Recovery time will be longer if this method is used. The procedures may vary among health care providers and hospitals. What happens after the procedure?  Your blood  pressure, heart rate, breathing rate, and blood oxygen level will be monitored until the medicines you were given have worn off.  The IV access tube and catheter will remain on your body for a period of time.  You may be given medicine for pain or to help you sleep.  You may be given an antibiotic medicine if needed.  Do not drive for 24 hours if you were given a sedative. Summary  Myomectomy is surgery to remove a noncancerous fibroid (myoma) from the uterus.  This surgery is usually done only if the tumor is growing or causing symptoms such as pain, pressure, bleeding, or pain during intercourse.  Follow instructions from your health care provider about eating and drinking before the procedure.  Recovery time from this procedure depends on the method. The abdominal method will require a longer recovery time. This information is not intended to replace advice given to you by your health care provider. Make sure you discuss any questions you have with your health care provider. Document Released: 05/05/2007 Document Revised: 08/08/2016 Document Reviewed: 08/08/2016 Elsevier Interactive Patient Education  2019 [employer].

## 2018-08-18 NOTE — Progress Notes (Signed)
Patient ID: Tricia Benitez, female   DOB: [birth year], 33 y.o.   MRN: [id number] Ms Petion presents with her partner for surgerical consult. Pt is scheduled for myomectomy on 09/01/18. Pt with known uterine fibroids. See prior notes and imaging EMBX normal  Management options have been discussed with pt. She desires myomectomy.  Pt has not completed her f/u for left breast mass. Has been BX in the past and was benign.  PE AF VSS Lungs clear Heart RRR Abd soft + BS abd/pelvic mass affected noted, consistent with enlarged uterus  A/P Uterine fibroids        DUB secondary to above        Left breast mass  Myomectomy reviewed with pt. Risks including but not limited to bleeding, infection and potential for hysterectomy reviewed. Future need for c section in pregnancy discussed. Additional R/B/Post Op care reviewed with pt. Pt verbalized understanding. Pt strongly encouraged to complete w/u for breast mass prior to myomectomy if possible F/U with post op appt.

## 2018-08-21 ENCOUNTER — Other Ambulatory Visit: Payer: Self-pay

## 2018-08-21 ENCOUNTER — Encounter (HOSPITAL_COMMUNITY)
Admission: RE | Admit: 2018-08-21 | Discharge: 2018-08-21 | Disposition: A | Discharge status: Home / Self Care | Payer: PRIVATE HEALTH INSURANCE | Source: Ambulatory Visit | Attending: Obstetrics and Gynecology | Admitting: Obstetrics and Gynecology

## 2018-08-21 ENCOUNTER — Encounter (HOSPITAL_COMMUNITY): Payer: Self-pay

## 2018-08-21 DIAGNOSIS — Z01812 Encounter for preprocedural laboratory examination: Secondary | ICD-10-CM | POA: Diagnosis present

## 2018-08-21 HISTORY — DX: Anemia, unspecified: D64.9

## 2018-08-21 LAB — CBC
HCT: 39.8 % (ref 36.0–46.0)
Hemoglobin: 12.5 g/dL (ref 12.0–15.0)
MCH: 23.9 pg — ABNORMAL LOW (ref 26.0–34.0)
MCHC: 31.4 g/dL (ref 30.0–36.0)
MCV: 76.1 fL — ABNORMAL LOW (ref 80.0–100.0)
Platelets: 289 10*3/uL (ref 150–400)
RBC: 5.23 MIL/uL — ABNORMAL HIGH (ref 3.87–5.11)
RDW: 14.6 % (ref 11.5–15.5)
WBC: 5.9 10*3/uL (ref 4.0–10.5)
nRBC: 0 % (ref 0.0–0.2)

## 2018-08-21 LAB — TYPE AND SCREEN
ABO/RH(D): A POS
ANTIBODY SCREEN: NEGATIVE

## 2018-08-21 LAB — BASIC METABOLIC PANEL
Anion gap: 7 (ref 5–15)
BUN: 10 mg/dL (ref 6–20)
CO2: 25 mmol/L (ref 22–32)
Calcium: 9.3 mg/dL (ref 8.9–10.3)
Chloride: 104 mmol/L (ref 98–111)
Creatinine, Ser: 0.59 mg/dL (ref 0.44–1.00)
GFR calc Af Amer: >60 mL/min (ref >60)
GFR calc non Af Amer: >60 mL/min (ref >60)
Glucose, Bld: 162 mg/dL — ABNORMAL HIGH (ref 70–99)
Potassium: 3.7 mmol/L (ref 3.5–5.1)
SODIUM: 136 mmol/L (ref 135–145)

## 2018-08-21 LAB — ABO/RH: ABO/RH(D): A POS

## 2018-08-26 ENCOUNTER — Other Ambulatory Visit: Payer: Self-pay | Admitting: Nurse Practitioner

## 2018-08-26 ENCOUNTER — Ambulatory Visit
Admission: RE | Admit: 2018-08-26 | Discharge: 2018-08-26 | Disposition: A | Discharge status: Home / Self Care | Payer: PRIVATE HEALTH INSURANCE | Source: Ambulatory Visit | Attending: Nurse Practitioner | Admitting: Nurse Practitioner

## 2018-08-26 DIAGNOSIS — N63 Unspecified lump in unspecified breast: Secondary | ICD-10-CM

## 2018-08-26 DIAGNOSIS — D241 Benign neoplasm of right breast: Secondary | ICD-10-CM

## 2018-08-26 DIAGNOSIS — N6320 Unspecified lump in the left breast, unspecified quadrant: Secondary | ICD-10-CM

## 2018-09-01 ENCOUNTER — Inpatient Hospital Stay (HOSPITAL_COMMUNITY): Payer: No Typology Code available for payment source | Admitting: Anesthesiology

## 2018-09-01 ENCOUNTER — Encounter (HOSPITAL_COMMUNITY): Payer: Self-pay | Admitting: Anesthesiology

## 2018-09-01 ENCOUNTER — Other Ambulatory Visit: Payer: Self-pay

## 2018-09-01 ENCOUNTER — Inpatient Hospital Stay (HOSPITAL_COMMUNITY)
Admission: RE | Admit: 2018-09-01 | Discharge: 2018-09-03 | DRG: 743 | Disposition: A | Discharge status: Home / Self Care | Payer: No Typology Code available for payment source | Attending: Obstetrics and Gynecology | Admitting: Obstetrics and Gynecology

## 2018-09-01 ENCOUNTER — Encounter (HOSPITAL_COMMUNITY)
Admission: RE | Disposition: A | Discharge status: Home / Self Care | Payer: Self-pay | Source: Home / Self Care | Attending: Obstetrics and Gynecology

## 2018-09-01 DIAGNOSIS — E119 Type 2 diabetes mellitus without complications: Secondary | ICD-10-CM | POA: Diagnosis present

## 2018-09-01 DIAGNOSIS — D259 Leiomyoma of uterus, unspecified: Principal | ICD-10-CM | POA: Diagnosis present

## 2018-09-01 DIAGNOSIS — Z7984 Long term (current) use of oral hypoglycemic drugs: Secondary | ICD-10-CM | POA: Diagnosis not present

## 2018-09-01 DIAGNOSIS — Z9889 Other specified postprocedural states: Secondary | ICD-10-CM

## 2018-09-01 DIAGNOSIS — Z880 Allergy status to penicillin: Secondary | ICD-10-CM

## 2018-09-01 DIAGNOSIS — N938 Other specified abnormal uterine and vaginal bleeding: Secondary | ICD-10-CM | POA: Diagnosis present

## 2018-09-01 HISTORY — PX: MYOMECTOMY: SHX85

## 2018-09-01 LAB — POCT I-STAT EG7
Acid-base deficit: 4 mmol/L — ABNORMAL HIGH (ref 0.0–2.0)
Bicarbonate: 20.4 mmol/L (ref 20.0–28.0)
Calcium, Ion: 1.18 mmol/L (ref 1.15–1.40)
HCT: 24 % — ABNORMAL LOW (ref 36.0–46.0)
Hemoglobin: 8.2 g/dL — ABNORMAL LOW (ref 12.0–15.0)
O2 Saturation: 87 %
Potassium: 4.2 mmol/L (ref 3.5–5.1)
SODIUM: 137 mmol/L (ref 135–145)
TCO2: 21 mmol/L — ABNORMAL LOW (ref 22–32)
pCO2, Ven: 33.4 mmHg — ABNORMAL LOW (ref 44.0–60.0)
pH, Ven: 7.395 (ref 7.250–7.430)
pO2, Ven: 54 mmHg — ABNORMAL HIGH (ref 32.0–45.0)

## 2018-09-01 LAB — GLUCOSE, CAPILLARY
Glucose-Capillary: 169 mg/dL — ABNORMAL HIGH (ref 70–99)
Glucose-Capillary: 265 mg/dL — ABNORMAL HIGH (ref 70–99)
Glucose-Capillary: 237 mg/dL — ABNORMAL HIGH (ref 70–99)

## 2018-09-01 LAB — HEMOGLOBIN AND HEMATOCRIT, BLOOD
HCT: 28.6 % — ABNORMAL LOW (ref 36.0–46.0)
Hemoglobin: 8.9 g/dL — ABNORMAL LOW (ref 12.0–15.0)

## 2018-09-01 LAB — PREGNANCY, URINE: Preg Test, Ur: NEGATIVE

## 2018-09-01 SURGERY — MYOMECTOMY, ABDOMINAL APPROACH
Anesthesia: General

## 2018-09-01 MED ORDER — PROPOFOL 10 MG/ML IV BOLUS
INTRAVENOUS | Status: AC
  Filled 2018-09-01: qty 40

## 2018-09-01 MED ORDER — BUPIVACAINE HCL 0.5 % IJ SOLN
INTRAMUSCULAR | Status: DC | PRN
  Administered 2018-09-01: 30 mL

## 2018-09-01 MED ORDER — HYDROMORPHONE HCL 1 MG/ML IJ SOLN
INTRAMUSCULAR | Status: DC | PRN
  Administered 2018-09-01: 1 mg via INTRAVENOUS

## 2018-09-01 MED ORDER — INSULIN ASPART 100 UNIT/ML ~~LOC~~ SOLN
0.0000 [IU] | Freq: Every day | SUBCUTANEOUS | Status: DC
  Administered 2018-09-01: 3 [IU] via SUBCUTANEOUS
  Administered 2018-09-02: 2 [IU] via SUBCUTANEOUS

## 2018-09-01 MED ORDER — CELECOXIB 200 MG PO CAPS
ORAL_CAPSULE | ORAL | Status: AC
  Filled 2018-09-01: qty 1

## 2018-09-01 MED ORDER — SUGAMMADEX SODIUM 200 MG/2ML IV SOLN
INTRAVENOUS | Status: DC | PRN
  Administered 2018-09-01: 160 mg via INTRAVENOUS

## 2018-09-01 MED ORDER — INSULIN ASPART 100 UNIT/ML ~~LOC~~ SOLN: 2.0000 [IU] | Freq: Once | SUBCUTANEOUS | Status: DC

## 2018-09-01 MED ORDER — DEXAMETHASONE SODIUM PHOSPHATE 10 MG/ML IJ SOLN
INTRAMUSCULAR | Status: AC
  Filled 2018-09-01: qty 1

## 2018-09-01 MED ORDER — VASOPRESSIN 20 UNIT/ML IV SOLN
INTRAVENOUS | Status: DC | PRN
  Administered 2018-09-01: 17 mL via INTRAMUSCULAR

## 2018-09-01 MED ORDER — ONDANSETRON HCL 4 MG/2ML IJ SOLN: 4.0000 mg | Freq: Four times a day (QID) | INTRAMUSCULAR | Status: DC | PRN

## 2018-09-01 MED ORDER — KETOROLAC TROMETHAMINE 30 MG/ML IJ SOLN
INTRAMUSCULAR | Status: AC
  Filled 2018-09-01: qty 1

## 2018-09-01 MED ORDER — INSULIN REGULAR HUMAN 100 UNIT/ML IJ SOLN: 2.0000 [IU] | Freq: Once | INTRAMUSCULAR | Status: DC

## 2018-09-01 MED ORDER — ALBUMIN HUMAN 5 % IV SOLN
INTRAVENOUS | Status: AC
  Filled 2018-09-01: qty 250

## 2018-09-01 MED ORDER — HYDROMORPHONE HCL 1 MG/ML IJ SOLN
INTRAMUSCULAR | Status: AC
  Filled 2018-09-01: qty 1

## 2018-09-01 MED ORDER — FENTANYL CITRATE (PF) 250 MCG/5ML IJ SOLN
INTRAMUSCULAR | Status: AC
  Filled 2018-09-01: qty 5

## 2018-09-01 MED ORDER — ONDANSETRON HCL 4 MG PO TABS: 4.0000 mg | ORAL_TABLET | Freq: Four times a day (QID) | ORAL | Status: DC | PRN

## 2018-09-01 MED ORDER — SCOPOLAMINE 1 MG/3DAYS TD PT72
1.0000 | MEDICATED_PATCH | Freq: Once | TRANSDERMAL | Status: DC
  Administered 2018-09-01: 1.5 mg via TRANSDERMAL

## 2018-09-01 MED ORDER — FENTANYL CITRATE (PF) 100 MCG/2ML IJ SOLN: 25.0000 ug | INTRAMUSCULAR | Status: DC | PRN

## 2018-09-01 MED ORDER — ACETAMINOPHEN 160 MG/5ML PO SOLN: 325.0000 mg | Freq: Once | ORAL | Status: DC

## 2018-09-01 MED ORDER — SOD CITRATE-CITRIC ACID 500-334 MG/5ML PO SOLN
30.0000 mL | ORAL | Status: AC
  Administered 2018-09-01: 30 mL via ORAL

## 2018-09-01 MED ORDER — KETOROLAC TROMETHAMINE 30 MG/ML IJ SOLN
15.0000 mg | INTRAMUSCULAR | Status: AC
  Administered 2018-09-01: 30 mg via INTRAVENOUS
  Filled 2018-09-01: qty 1

## 2018-09-01 MED ORDER — TRANEXAMIC ACID-NACL 1000-0.7 MG/100ML-% IV SOLN
INTRAVENOUS | Status: AC
  Filled 2018-09-01: qty 100

## 2018-09-01 MED ORDER — IBUPROFEN 800 MG PO TABS
800.0000 mg | ORAL_TABLET | Freq: Three times a day (TID) | ORAL | Status: DC
  Administered 2018-09-02 – 2018-09-03 (×2): 800 mg via ORAL
  Filled 2018-09-01 (×2): qty 1

## 2018-09-01 MED ORDER — CELECOXIB 200 MG PO CAPS
400.0000 mg | ORAL_CAPSULE | ORAL | Status: AC
  Administered 2018-09-01: 400 mg via ORAL

## 2018-09-01 MED ORDER — SCOPOLAMINE 1 MG/3DAYS TD PT72
MEDICATED_PATCH | TRANSDERMAL | Status: AC
  Administered 2018-09-01: 1.5 mg via TRANSDERMAL
  Filled 2018-09-01: qty 1

## 2018-09-01 MED ORDER — FENTANYL CITRATE (PF) 100 MCG/2ML IJ SOLN
INTRAMUSCULAR | Status: DC | PRN
  Administered 2018-09-01 (×5): 50 ug via INTRAVENOUS

## 2018-09-01 MED ORDER — LIDOCAINE HCL (CARDIAC) PF 100 MG/5ML IV SOSY
PREFILLED_SYRINGE | INTRAVENOUS | Status: AC
  Filled 2018-09-01: qty 5

## 2018-09-01 MED ORDER — LIDOCAINE HCL (CARDIAC) PF 100 MG/5ML IV SOSY
PREFILLED_SYRINGE | INTRAVENOUS | Status: DC | PRN
  Administered 2018-09-01: 80 mg via INTRAVENOUS

## 2018-09-01 MED ORDER — HYDROMORPHONE HCL 1 MG/ML IJ SOLN
1.0000 mg | INTRAMUSCULAR | Status: DC | PRN
  Administered 2018-09-02: 1 mg via INTRAVENOUS
  Filled 2018-09-01: qty 1

## 2018-09-01 MED ORDER — INSULIN ASPART 100 UNIT/ML ~~LOC~~ SOLN
0.0000 [IU] | Freq: Three times a day (TID) | SUBCUTANEOUS | Status: DC
  Administered 2018-09-02: 3 [IU] via SUBCUTANEOUS
  Administered 2018-09-02: 2 [IU] via SUBCUTANEOUS
  Administered 2018-09-02 – 2018-09-03 (×2): 3 [IU] via SUBCUTANEOUS

## 2018-09-01 MED ORDER — MEPERIDINE HCL 25 MG/ML IJ SOLN
6.2500 mg | INTRAMUSCULAR | Status: DC | PRN
  Administered 2018-09-01 (×2): 12.5 mg via INTRAVENOUS

## 2018-09-01 MED ORDER — LACTATED RINGERS IV SOLN: INTRAVENOUS | Status: DC

## 2018-09-01 MED ORDER — GABAPENTIN 300 MG PO CAPS
300.0000 mg | ORAL_CAPSULE | Freq: Two times a day (BID) | ORAL | Status: DC
  Administered 2018-09-01 – 2018-09-02 (×3): 300 mg via ORAL
  Filled 2018-09-01 (×4): qty 1

## 2018-09-01 MED ORDER — OXYCODONE-ACETAMINOPHEN 5-325 MG PO TABS
2.0000 | ORAL_TABLET | ORAL | Status: DC | PRN
  Filled 2018-09-01: qty 2

## 2018-09-01 MED ORDER — ACETAMINOPHEN 500 MG PO TABS
1000.0000 mg | ORAL_TABLET | ORAL | Status: AC
  Administered 2018-09-01: 1000 mg via ORAL

## 2018-09-01 MED ORDER — ACETAMINOPHEN 10 MG/ML IV SOLN
1000.0000 mg | Freq: Once | INTRAVENOUS | Status: DC | PRN
  Administered 2018-09-01: 1000 mg via INTRAVENOUS

## 2018-09-01 MED ORDER — BUPIVACAINE HCL (PF) 0.5 % IJ SOLN
INTRAMUSCULAR | Status: AC
  Filled 2018-09-01: qty 30

## 2018-09-01 MED ORDER — INSULIN ASPART 100 UNIT/ML ~~LOC~~ SOLN
SUBCUTANEOUS | Status: AC
  Administered 2018-09-01: 2 [IU]
  Filled 2018-09-01: qty 1

## 2018-09-01 MED ORDER — ZOLPIDEM TARTRATE 5 MG PO TABS: 5.0000 mg | ORAL_TABLET | Freq: Every evening | ORAL | Status: DC | PRN

## 2018-09-01 MED ORDER — MIDAZOLAM HCL 2 MG/2ML IJ SOLN
INTRAMUSCULAR | Status: AC
  Filled 2018-09-01: qty 2

## 2018-09-01 MED ORDER — SIMETHICONE 80 MG PO CHEW: 80.0000 mg | CHEWABLE_TABLET | Freq: Four times a day (QID) | ORAL | Status: DC | PRN

## 2018-09-01 MED ORDER — ONDANSETRON HCL 4 MG/2ML IJ SOLN
INTRAMUSCULAR | Status: DC | PRN
  Administered 2018-09-01: 4 mg via INTRAVENOUS

## 2018-09-01 MED ORDER — ROCURONIUM BROMIDE 100 MG/10ML IV SOLN
INTRAVENOUS | Status: AC
  Filled 2018-09-01: qty 1

## 2018-09-01 MED ORDER — MEPERIDINE HCL 25 MG/ML IJ SOLN
INTRAMUSCULAR | Status: AC
  Filled 2018-09-01: qty 1

## 2018-09-01 MED ORDER — GENTAMICIN SULFATE 40 MG/ML IJ SOLN
INTRAVENOUS | Status: AC
  Administered 2018-09-01: 328 mg via INTRAVENOUS
  Filled 2018-09-01: qty 8.25

## 2018-09-01 MED ORDER — ACETAMINOPHEN 325 MG PO TABS: 325.0000 mg | ORAL_TABLET | Freq: Once | ORAL | Status: DC

## 2018-09-01 MED ORDER — 0.9 % SODIUM CHLORIDE (POUR BTL) OPTIME
TOPICAL | Status: DC | PRN
  Administered 2018-09-01: 2000 mL

## 2018-09-01 MED ORDER — ONDANSETRON HCL 4 MG/2ML IJ SOLN
INTRAMUSCULAR | Status: AC
  Filled 2018-09-01: qty 2

## 2018-09-01 MED ORDER — PROPOFOL 10 MG/ML IV BOLUS
INTRAVENOUS | Status: AC
  Filled 2018-09-01: qty 20

## 2018-09-01 MED ORDER — ALBUMIN HUMAN 5 % IV SOLN
INTRAVENOUS | Status: DC | PRN
  Administered 2018-09-01: 18:00:00 via INTRAVENOUS

## 2018-09-01 MED ORDER — LACTATED RINGERS IV SOLN
INTRAVENOUS | Status: DC
  Administered 2018-09-01 – 2018-09-02 (×2): via INTRAVENOUS

## 2018-09-01 MED ORDER — MIDAZOLAM HCL 2 MG/2ML IJ SOLN
INTRAMUSCULAR | Status: DC | PRN
  Administered 2018-09-01: 2 mg via INTRAVENOUS

## 2018-09-01 MED ORDER — KETOROLAC TROMETHAMINE 30 MG/ML IJ SOLN
30.0000 mg | Freq: Four times a day (QID) | INTRAMUSCULAR | Status: AC
  Administered 2018-09-01 – 2018-09-02 (×2): 30 mg via INTRAVENOUS
  Filled 2018-09-01 (×2): qty 1

## 2018-09-01 MED ORDER — ACETAMINOPHEN 500 MG PO TABS
ORAL_TABLET | ORAL | Status: AC
  Administered 2018-09-01: 1000 mg via ORAL
  Filled 2018-09-01: qty 2

## 2018-09-01 MED ORDER — OXYCODONE-ACETAMINOPHEN 5-325 MG PO TABS: 1.0000 | ORAL_TABLET | ORAL | Status: DC | PRN

## 2018-09-01 MED ORDER — PROPOFOL 10 MG/ML IV BOLUS
INTRAVENOUS | Status: DC | PRN
  Administered 2018-09-01: 150 mg via INTRAVENOUS

## 2018-09-01 MED ORDER — ROCURONIUM BROMIDE 100 MG/10ML IV SOLN
INTRAVENOUS | Status: DC | PRN
  Administered 2018-09-01 (×2): 10 mg via INTRAVENOUS
  Administered 2018-09-01: 60 mg via INTRAVENOUS
  Administered 2018-09-01: 20 mg via INTRAVENOUS

## 2018-09-01 MED ORDER — SENNOSIDES-DOCUSATE SODIUM 8.6-50 MG PO TABS: 1.0000 | ORAL_TABLET | Freq: Every evening | ORAL | Status: DC | PRN

## 2018-09-01 MED ORDER — GLYCOPYRROLATE 0.2 MG/ML IJ SOLN
INTRAMUSCULAR | Status: AC
  Filled 2018-09-01: qty 1

## 2018-09-01 MED ORDER — ACETAMINOPHEN 10 MG/ML IV SOLN
INTRAVENOUS | Status: AC
  Filled 2018-09-01: qty 100

## 2018-09-01 MED ORDER — SOD CITRATE-CITRIC ACID 500-334 MG/5ML PO SOLN
ORAL | Status: AC
  Administered 2018-09-01: 30 mL via ORAL
  Filled 2018-09-01: qty 15

## 2018-09-01 MED ORDER — DEXAMETHASONE SODIUM PHOSPHATE 10 MG/ML IJ SOLN
INTRAMUSCULAR | Status: DC | PRN
  Administered 2018-09-01: 10 mg via INTRAVENOUS

## 2018-09-01 MED ORDER — PROMETHAZINE HCL 25 MG/ML IJ SOLN: 6.2500 mg | INTRAMUSCULAR | Status: DC | PRN

## 2018-09-01 MED ORDER — LACTATED RINGERS IV SOLN
INTRAVENOUS | Status: DC
  Administered 2018-09-01 (×4): via INTRAVENOUS

## 2018-09-01 MED ORDER — TRANEXAMIC ACID-NACL 1000-0.7 MG/100ML-% IV SOLN
1000.0000 mg | INTRAVENOUS | Status: AC
  Administered 2018-09-01: 1000 mg via INTRAVENOUS
  Filled 2018-09-01: qty 100

## 2018-09-01 SURGICAL SUPPLY — 48 items
BARRIER ADHS 3X4 INTERCEED (GAUZE/BANDAGES/DRESSINGS) ×3 IMPLANT
BENZOIN TINCTURE PRP APPL 2/3 (GAUZE/BANDAGES/DRESSINGS) ×3 IMPLANT
CANISTER SUCT 3000ML PPV (MISCELLANEOUS) ×3 IMPLANT
CLOSURE WOUND 1/2 X4 (GAUZE/BANDAGES/DRESSINGS) ×1
CONT PATH 16OZ SNAP LID 3702 (MISCELLANEOUS) ×3 IMPLANT
COUNTER NEEDLE 1200 MAGNETIC (NEEDLE) ×3 IMPLANT
DECANTER SPIKE VIAL GLASS SM (MISCELLANEOUS) IMPLANT
DRAIN PENROSE 1/2X12 (DRAIN) ×3 IMPLANT
DRAPE CESAREAN BIRTH W POUCH (DRAPES) ×3 IMPLANT
DRAPE WARM FLUID 44X44 (DRAPE) IMPLANT
DRSG OPSITE POSTOP 4X10 (GAUZE/BANDAGES/DRESSINGS) ×3 IMPLANT
DURAPREP 26ML APPLICATOR (WOUND CARE) ×3 IMPLANT
ELECT BLADE 6.5 EXT (BLADE) IMPLANT
ELECT NEEDLE TIP 2.8 STRL (NEEDLE) IMPLANT
GAUZE 4X4 16PLY RFD (DISPOSABLE) IMPLANT
GLOVE BIO SURGEON STRL SZ7.5 (GLOVE) ×3 IMPLANT
GLOVE BIOGEL PI IND STRL 7.0 (GLOVE) ×2 IMPLANT
GLOVE BIOGEL PI INDICATOR 7.0 (GLOVE) ×4
GOWN STRL REUS W/TWL LRG LVL3 (GOWN DISPOSABLE) ×6 IMPLANT
GOWN STRL REUS W/TWL XL LVL3 (GOWN DISPOSABLE) ×3 IMPLANT
HIBICLENS CHG 4% 4OZ BTL (MISCELLANEOUS) ×3 IMPLANT
NEEDLE HYPO 22GX1.5 SAFETY (NEEDLE) ×3 IMPLANT
NS IRRIG 1000ML POUR BTL (IV SOLUTION) ×3 IMPLANT
PACK ABDOMINAL GYN (CUSTOM PROCEDURE TRAY) ×3 IMPLANT
PAD OB MATERNITY 4.3X12.25 (PERSONAL CARE ITEMS) ×3 IMPLANT
PENCIL SMOKE EVAC W/HOLSTER (ELECTROSURGICAL) ×3 IMPLANT
PROTECTOR NERVE ULNAR (MISCELLANEOUS) ×3 IMPLANT
RTRCTR C-SECT PINK 25CM LRG (MISCELLANEOUS) ×3 IMPLANT
SPONGE LAP 18X18 RF (DISPOSABLE) IMPLANT
STAPLER VISISTAT 35W (STAPLE) ×3 IMPLANT
STRIP CLOSURE SKIN 1/2X4 (GAUZE/BANDAGES/DRESSINGS) ×2 IMPLANT
SUT VIC AB 0 CT1 18XCR BRD8 (SUTURE) ×12 IMPLANT
SUT VIC AB 0 CT1 27 (SUTURE)
SUT VIC AB 0 CT1 27XBRD ANBCTR (SUTURE) IMPLANT
SUT VIC AB 0 CT1 8-18 (SUTURE) ×24
SUT VIC AB 1 CT1 18XBRD ANBCTR (SUTURE) IMPLANT
SUT VIC AB 1 CT1 36 (SUTURE) IMPLANT
SUT VIC AB 1 CT1 8-18 (SUTURE)
SUT VIC AB 1 CTX 36 (SUTURE) ×4
SUT VIC AB 1 CTX36XBRD ANBCTRL (SUTURE) ×2 IMPLANT
SUT VIC AB 3-0 CT1 27 (SUTURE)
SUT VIC AB 3-0 CT1 TAPERPNT 27 (SUTURE) IMPLANT
SUT VIC AB 3-0 SH 27 (SUTURE) ×4
SUT VIC AB 3-0 SH 27X BRD (SUTURE) ×2 IMPLANT
SUT VIC AB 4-0 KS 27 (SUTURE) ×3 IMPLANT
SUT VICRYL 1 TIES 12X18 (SUTURE) ×3 IMPLANT
SYR CONTROL 10ML LL (SYRINGE) ×3 IMPLANT
TOWEL OR 17X24 6PK STRL BLUE (TOWEL DISPOSABLE) ×6 IMPLANT

## 2018-09-01 NOTE — Plan of Care (Signed)
  Problem: Education: Goal: Knowledge of the prescribed therapeutic regimen will improve Outcome: Progressing   Problem: Education: Goal: Understanding of sexual limitations or changes related to disease process or condition will improve Outcome: Progressing   Problem: Skin Integrity: Goal: Demonstration of wound healing without infection will improve Outcome: Progressing

## 2018-09-01 NOTE — Transfer of Care (Signed)
Immediate Anesthesia Transfer of Care Note  Patient: Tricia Benitez  Procedure(s) Performed: ABDOMINAL MYOMECTOMY (N/A )  Patient Location: PACU  Anesthesia Type:General  Level of Consciousness: awake, alert  and oriented  Airway & Oxygen Therapy: Patient Spontanous Breathing and Patient connected to nasal cannula oxygen  Post-op Assessment: Report given to RN and Post -op Vital signs reviewed and stable  Post vital signs: Reviewed and stable  Last Vitals:  Vitals Value Taken Time  BP    Temp    Pulse 81 09/01/2018  7:04 PM  Resp    SpO2 100 % 09/01/2018  7:04 PM  Vitals shown include unvalidated device data.  Last Pain:  Vitals:   09/01/18 1334  TempSrc: Oral  PainSc: 0-No pain      Patients Stated Pain Goal: 4 (40/39/79 [id number])  Complications: No apparent anesthesia complications

## 2018-09-01 NOTE — Brief Op Note (Signed)
09/01/2018  7:09 PM  PATIENT:  Tricia Benitez  33 y.o. female  PRE-OPERATIVE DIAGNOSIS:  Uterine Fibroid  POST-OPERATIVE DIAGNOSIS:  Uterine Fibroid  PROCEDURE:  Procedure(s): ABDOMINAL MYOMECTOMY (N/A)  SURGEON:  Surgeon(s) and Role:    * Chancy Milroy, MD - Primary    * Sloan Leiter, MD - Assisting  PHYSICIAN ASSISTANT:   ASSISTANTS: Dr. Edythe Clarity   ANESTHESIA:   general  EBL:  1400 mL  UOP: 300 cc IV fluids 2000 cc Albumin 250 cc  BLOOD ADMINISTERED:none  DRAINS: none   LOCAL MEDICATIONS USED:  MARCAINE     SPECIMEN:  Source of Specimen:  uterus  DISPOSITION OF SPECIMEN:  PATHOLOGY  COUNTS:  YES  TOURNIQUET:  * No tourniquets in log *   PLAN OF CARE: Admit to inpatient   PATIENT DISPOSITION:  PACU - hemodynamically stable.   Delay start of Pharmacological VTE agent (>24hrs) due to surgical blood loss or risk of bleeding: not applicable

## 2018-09-01 NOTE — Anesthesia Procedure Notes (Signed)
Procedure Name: Intubation Date/Time: 09/01/2018 4:07 PM Performed by: Georgeanne Nim, CRNA Pre-anesthesia Checklist: Patient identified, Emergency Drugs available, Suction available, Patient being monitored and Timeout performed Patient Re-evaluated:Patient Re-evaluated prior to induction Oxygen Delivery Method: Circle system utilized Preoxygenation: Pre-oxygenation with 100% oxygen Induction Type: IV induction Ventilation: Mask ventilation without difficulty Laryngoscope Size: Mac and 4 Grade View: Grade I Tube type: Oral Tube size: 7.0 mm Number of attempts: 1 Airway Equipment and Method: Stylet Placement Confirmation: ETT inserted through vocal cords under direct vision,  positive ETCO2,  CO2 detector and breath sounds checked- equal and bilateral Tube secured with: Tape Dental Injury: Teeth and Oropharynx as per pre-operative assessment

## 2018-09-01 NOTE — Anesthesia Preprocedure Evaluation (Addendum)
Anesthesia Evaluation  Patient identified by MRN, date of birth, ID band Patient awake    Reviewed: Allergy & Precautions, NPO status , Patient's Chart, lab work & pertinent test results  Airway Mallampati: I  TM Distance: >3 FB Neck ROM: Full    Dental  (+) Teeth Intact, Dental Advisory Given   Pulmonary asthma ,    breath sounds clear to auscultation       Cardiovascular negative cardio ROS   Rhythm:Regular Rate:Normal     Neuro/Psych negative neurological ROS     GI/Hepatic negative GI ROS, Neg liver ROS,   Endo/Other  diabetes, Type 2, Oral Hypoglycemic Agents  Renal/GU negative Renal ROS     Musculoskeletal   Abdominal Normal abdominal exam  (+)   Peds  Hematology negative hematology ROS (+)   Anesthesia Other Findings   Reproductive/Obstetrics                            Anesthesia Physical Anesthesia Plan  ASA: II  Anesthesia Plan: General   Post-op Pain Management:    Induction: Intravenous  PONV Risk Score and Plan: 4 or greater and Ondansetron, Dexamethasone, Midazolam and Scopolamine patch - Pre-op  Airway Management Planned: Oral ETT  Additional Equipment: None  Intra-op Plan:   Post-operative Plan: Extubation in OR  Informed Consent: I have reviewed the patients History and Physical, chart, labs and discussed the procedure including the risks, benefits and alternatives for the proposed anesthesia with the patient or authorized representative who has indicated his/her understanding and acceptance.       Plan Discussed with: CRNA  Anesthesia Plan Comments:        Anesthesia Quick Evaluation

## 2018-09-01 NOTE — H&P (Signed)
Tricia Benitez is an 33 y.o. female with known enlarged uterus consistent with uterine fibroids. CT and U/S have confirmed at least 3 large fibroids ranging in size from 10-14 cm. She has been experiencing irregular heavy cycles for several months now. Suspected to be d/t the uterine fibroids. She has had a negative negative pap smear and EMBX.Marland Kitchen  She desires removal of the uterine fibroids.  H/O SAB x 1  H/O breast lesion, W/U consistent with fibroma.   In a same sex relationship   Menstrual History: Menarche age: 75 Patient's last menstrual period was 08/04/2018 (approximate).    Past Medical History:  Diagnosis Date  . Anemia   . Asthma   . Diabetes mellitus without complication ([place])   . UTI (urinary tract infection)     Past Surgical History:  Procedure Laterality Date  . BREAST BIOPSY Left 2015   cyst removed.   Marland Kitchen MOUTH SURGERY     teeth ext   . TONSILLECTOMY      Family History  Problem Relation Age of Onset  . Fibroids Mother   . Asthma Mother   . Diabetes Father   . Kidney disease Father   . Alcohol abuse Father   . Diabetes Paternal Grandmother   . Hyperlipidemia Paternal Grandmother   . Hypertension Paternal Grandmother   . Alcohol abuse Maternal Grandmother     Social History:  reports that she has never smoked. She has never used smokeless tobacco. She reports current alcohol use. She reports that she does not use drugs.  Allergies:  Allergies  Allergen Reactions  . Penicillins Other (See Comments)    Do not remember symptoms but was told DO NOT TAKE.   Marland Kitchen Glipizide Hives    No medications prior to admission.    Review of Systems  Constitutional: Negative.   Respiratory: Negative.   Cardiovascular: Negative.   Gastrointestinal: Negative.   Genitourinary: Negative.     Last menstrual period 08/04/2018. Physical Exam  Constitutional: She appears well-developed and well-nourished.  Cardiovascular: Normal rate and normal heart sounds.   Respiratory: Effort normal and breath sounds normal.  GI:  Soft + BS abd/pelvic mass effect consistent with uterine fibroids  Genitourinary:    Genitourinary Comments: Nl EGBUS, cervix without lesions, uterus enlarged 18-20 weeks with irregular contour     No results found for this or any previous visit (from the past 24 hour(s)).  No results found.  Assessment/Plan: Uterine fibroids DUB suspected to d/t above  Pt desires removal of uterine fibroids. Abd myomectomy has been reviewed with pt. R/B/Post op care discussed, including but not limited to injury, blood transfusion, infection and potential for hysterectomy. Pt informed as well to future need for c section if pregnancy achieved. Pt has verbalized understanding and desires to proceed.   Chancy Milroy 09/01/2018, 8:27 AM

## 2018-09-01 NOTE — Anesthesia Postprocedure Evaluation (Signed)
Anesthesia Post Note  Patient: Tricia Benitez  Procedure(s) Performed: ABDOMINAL MYOMECTOMY (N/A )     Patient location during evaluation: PACU Anesthesia Type: General Level of consciousness: awake and alert Pain management: pain level controlled Vital Signs Assessment: post-procedure vital signs reviewed and stable Respiratory status: spontaneous breathing, nonlabored ventilation, respiratory function stable and patient connected to nasal cannula oxygen Cardiovascular status: blood pressure returned to baseline and stable Postop Assessment: no apparent nausea or vomiting Anesthetic complications: no    Last Vitals:  Vitals:   09/01/18 1945 09/01/18 2000  BP: 123/83 116/76  Pulse: 80 79  Resp: 12 14  Temp:  37 C  SpO2: 98% 98%    Last Pain:  Vitals:   09/01/18 2000  TempSrc:   PainSc: 5    Pain Goal: Patients Stated Pain Goal: 4 (09/01/18 1334)                 Rodolfo Gaster L Max[place] Skelton

## 2018-09-02 ENCOUNTER — Encounter (HOSPITAL_COMMUNITY): Payer: Self-pay | Admitting: Obstetrics and Gynecology

## 2018-09-02 DIAGNOSIS — D259 Leiomyoma of uterus, unspecified: Secondary | ICD-10-CM

## 2018-09-02 LAB — CBC
HCT: 28.5 % — ABNORMAL LOW (ref 36.0–46.0)
HCT: 27.1 % — ABNORMAL LOW (ref 36.0–46.0)
Hemoglobin: 8.9 g/dL — ABNORMAL LOW (ref 12.0–15.0)
Hemoglobin: 8.6 g/dL — ABNORMAL LOW (ref 12.0–15.0)
MCH: 23.9 pg — ABNORMAL LOW (ref 26.0–34.0)
MCH: 24.2 pg — ABNORMAL LOW (ref 26.0–34.0)
MCHC: 31.2 g/dL (ref 30.0–36.0)
MCHC: 31.7 g/dL (ref 30.0–36.0)
MCV: 76.4 fL — ABNORMAL LOW (ref 80.0–100.0)
MCV: 76.3 fL — ABNORMAL LOW (ref 80.0–100.0)
NRBC: 0 % (ref 0.0–0.2)
Platelets: 254 10*3/uL (ref 150–400)
Platelets: 267 10*3/uL (ref 150–400)
RBC: 3.73 MIL/uL — ABNORMAL LOW (ref 3.87–5.11)
RBC: 3.55 MIL/uL — ABNORMAL LOW (ref 3.87–5.11)
RDW: 14.7 % (ref 11.5–15.5)
RDW: 14.7 % (ref 11.5–15.5)
WBC: 13.9 10*3/uL — ABNORMAL HIGH (ref 4.0–10.5)
WBC: 10.8 10*3/uL — ABNORMAL HIGH (ref 4.0–10.5)
nRBC: 0 % (ref 0.0–0.2)

## 2018-09-02 LAB — BASIC METABOLIC PANEL
ANION GAP: 7 (ref 5–15)
Anion gap: 6 (ref 5–15)
BUN: 9 mg/dL (ref 6–20)
BUN: 9 mg/dL (ref 6–20)
CO2: 21 mmol/L — ABNORMAL LOW (ref 22–32)
CO2: 24 mmol/L (ref 22–32)
Calcium: 8.1 mg/dL — ABNORMAL LOW (ref 8.9–10.3)
Calcium: 8.4 mg/dL — ABNORMAL LOW (ref 8.9–10.3)
Chloride: 103 mmol/L (ref 98–111)
Chloride: 106 mmol/L (ref 98–111)
Creatinine, Ser: 0.55 mg/dL (ref 0.44–1.00)
Creatinine, Ser: 0.63 mg/dL (ref 0.44–1.00)
GFR calc Af Amer: >60 mL/min (ref >60)
GFR calc Af Amer: >60 mL/min (ref >60)
GFR calc non Af Amer: >60 mL/min (ref >60)
GLUCOSE: 204 mg/dL — AB (ref 70–99)
Glucose, Bld: 274 mg/dL — ABNORMAL HIGH (ref 70–99)
Potassium: 4.1 mmol/L (ref 3.5–5.1)
Potassium: 3.7 mmol/L (ref 3.5–5.1)
Sodium: 131 mmol/L — ABNORMAL LOW (ref 135–145)
Sodium: 136 mmol/L (ref 135–145)

## 2018-09-02 LAB — GLUCOSE, CAPILLARY
GLUCOSE-CAPILLARY: 164 mg/dL — AB (ref 70–99)
Glucose-Capillary: 222 mg/dL — ABNORMAL HIGH (ref 70–99)
Glucose-Capillary: 204 mg/dL — ABNORMAL HIGH (ref 70–99)
Glucose-Capillary: 186 mg/dL — ABNORMAL HIGH (ref 70–99)
Glucose-Capillary: 152 mg/dL — ABNORMAL HIGH (ref 70–99)
Glucose-Capillary: 142 mg/dL — ABNORMAL HIGH (ref 70–99)
Glucose-Capillary: 223 mg/dL — ABNORMAL HIGH (ref 70–99)

## 2018-09-02 MED ORDER — METFORMIN HCL 850 MG PO TABS
850.0000 mg | ORAL_TABLET | Freq: Every day | ORAL | Status: DC
  Administered 2018-09-02 – 2018-09-03 (×2): 850 mg via ORAL
  Filled 2018-09-02 (×2): qty 1

## 2018-09-02 NOTE — Progress Notes (Signed)
1 Day Post-Op Procedure(s) (LRB): ABDOMINAL MYOMECTOMY (N/A)  Subjective: No complaints this morning except for some pain and feeling sore. Pain medication helps. Tolerating liquids..    Objective: AF VSS Good UOP  Lungs clear Heart RRR Abd soft faint BS drsg CDI GU foley Ext SCD's    Assessment: POD # 1 Abd myomectomy DM  Plan: Stable Labs pending this AM. D/C foley and IV fluids. Ambulate. Restart Metformin. Continue with progressive care.    LOS: 1 day    Tricia Benitez 09/02/2018, 8:17 AM

## 2018-09-02 NOTE — Op Note (Signed)
[place] PROCEDURE DATE: 09/01/2018  PREOPERATIVE DIAGNOSIS: Symptomatic uterine leiomyomata. POSTOPERATIVE DIAGNOSIS: The same PROCEDURE: Abdominal Myomectomy SURGEON:  Dr.Michael Ervin ASSISTANT: Dr. Edythe Clarity  INDICATIONS: 33 y.o. G1P0010 here for abdominal myomectomy for symptomatic uterine leiomyomata.  Risks of surgery were discussed with the patient including but not limited to: bleeding which may require transfusion or reoperation and hysterectomy (3-4% risk of intraoperative conversion to hysterectomy); infection which may require antibiotics; injury to bowel, bladder, ureters or other surrounding organs; need for additional procedures; formation of intraperitoneal adhesions that may lead to other complications or make future surgeries more difficult;  thromboembolic phenomenon, incisional problems and other postoperative/anesthesia complications.  Also discussed possible need for cesarean deliveries at 59 - [redacted] weeks gestation for subsequent pregnancies if the endometrial cavity is breached and possible recurrence of fibroids; 25% of women who undergo this procedure require further treatment. Written informed consent was obtained.    FINDINGS:  Uterine leiomyomata. 3 in number, ranging in size  10-15 cm. Endometrial cavity was not breached.  ANESTHESIA:    General INTRAVENOUS FLUIDS:2000  Ml 250 cc Albumin ESTIMATED BLOOD LOSS:1400 ml URINE OUTPUT: 300 ml SPECIMENS: Leiomyomata sent to pathology COMPLICATIONS: None immediate  PROCEDURE IN DETAIL:  The patient received IV preoperative antibiotics approximately 30 minutes prior to procedure.  She was then taken to the operating room and general anesthesia was administered without difficulty.  She also received 1000 mg of TXA prior to the start of the case. She was placed in dorsal supine position and prepped and draped in a sterile manner.  A Foley catheter was inserted into the bladder and attached to constant drainage.  After an  adequate timeout was performed, attention was then turned to the abdomen where a midline incision was made with a scalpel.  This incision was carried down to the fascia using electrocautery.  The fascia was incised in the midline and this incision was extended bilaterally using the Mayo scissors. The fascia dissected off bluntly and sharply using the Mayo scissors from the underlying rectus muscles. The rectus muscles were separated in the midline bluntly and the peritoneum was identified, picked up and incised with the scalpel.   Attention was then turned to the uterus where multiple uterine leiomyomata were noted.  The uterus was noted to be rotated to were the left adnexal structures were visualized almost at midline. The right adnexal was noted to almost directly posterior. An Alexi retractor was placed. Due to the size of the uterine leiomyomata, the uterus could not be exteriorized. A large left lateral leiomyoma was noted. It was easily assessable.  Vasopressin solution was injected over the surface of the leiomyoma to aid with hemostasis.   A vertical incision was made over the uterine leiomyoma into the leiomyoma, and the capsule was recognized.  Using blunt methods, the leiomyoma was freed from the surrounding myometrial tissue and removed intact. The myometrial defect was then closed with several interrupted 0 Vicryl in layer fashion. The serosa incision was then closed with 3/0 Vicryl in a baseball stitch fashion. Hemostasis was noted. A small @ 2 cm leiomyoma was noted just superior to the entry of the left fallopian tube in the cornua of the uterus.  It was noted to be bleeding, suspected due to manipulation of the uterus. It was removed bluntly and the myometrial defect was closed with 0 Vicryl. Care was taken to avoid the fallopian tube. Attention was then directed to a large fundal leiomyoma. It was grasped with towel clips  and delivered through the incision.  Again Vasopressin was injected over the  of the leiomyoma to aid in hemostasis. A vertical incision was made over the leiomyoma as described above. The leiomyoma was removed in similar fashion as described previously. The myometrial defect and serosa were closed in similar fashion as described previously.  Hemostasis was noted. Another large leiomyoma was noted in the left lower uterine segment.  It was removed and the myometrial and serosa were closed in similar fashion as previously described. Hemostasis was noted. All incision sites were reviewed and hemostasis ws noted. Three separate pieces of Interceed were placed over the incision sites to reduce adhesions. The uterus was then placed back into its normal anatomical position.  The laparotomy sponges and retractor were removed.  Kelly clamps were placed on the peritoneum and the peritoneum and rectus muscles were closed using 0 Vicryl .  The fascia was reapproximated with 0 Vicryl running stitch and the subcutaneous layer was reapproximated with 3-0 Vicryl interrupted sutures.  The skin was closed with 4-0 Vicryl subcuticular stitch using a Lanny Hurst needle.  Incision was also closed with skin clips. Dressing was applied.  The patient tolerated the procedure well. There were no complications during this case.  Sponge, lap, needle and instrument counts were correct x2.  The patient was taken to the recovery room extubated and in stable condition.  An experienced assistant was required given the standard of surgical care given the complexity of the case.  This assistant was needed for exposure, dissection, suctioning, retraction and for overall help during the procedure.  Arlina Robes, MD, [place] Attending [place], [place]

## 2018-09-03 LAB — GLUCOSE, CAPILLARY: Glucose-Capillary: 183 mg/dL — ABNORMAL HIGH (ref 70–99)

## 2018-09-03 MED ORDER — IBUPROFEN 800 MG PO TABS: 800.0000 mg | ORAL_TABLET | Freq: Three times a day (TID) | ORAL | 0 refills | Status: DC

## 2018-09-03 MED ORDER — OXYCODONE-ACETAMINOPHEN 5-325 MG PO TABS: 1.0000 | ORAL_TABLET | ORAL | 0 refills | Status: DC | PRN

## 2018-09-03 NOTE — Discharge Summary (Signed)
Physician Discharge Summary  Patient ID: Tricia Benitez MRN: [id number] DOB/AGE: [birth year] 32 y.o.  Admit date: 09/01/2018 Discharge date: 09/03/2018  Admission Diagnoses: Uterine leiomyomata  Discharge Diagnoses:  Active Problems:   Status post myomectomy   Post-operative state   Discharged Condition: good  Hospital Course: Tricia Benitez was admitted for abdomen myomectomy due to above Dx. See OP note for additional information. Post op course was unremarkable. Progressed to ambulating, voiding, passing flatus, tolerating diet and good oral pain control.  DM was management with SSI and pt was restarted on her home DM medication. On POD # 2 felt pt was amendable for discharge home. Discharge instructions and medications were reviewed with pt. Pt verbalized understanding.   Consults: None  Significant Diagnostic Studies: labs  Treatments: surgery: Abd myomectomy  Discharge Exam: Blood pressure 105/67, pulse 93, temperature 99.6 F (37.6 C), temperature source Oral, resp. rate 18, height 5\' 5"  (1.651 m), weight 79.4 kg, last menstrual period 08/04/2018, SpO2 100 %. Lungs clear Heart RRR Abd soft + BS drsg intact GU no bleeding Ext non tender  Disposition: Discharge disposition: 01-Home or Self Care       Discharge Instructions    Call MD for:  difficulty breathing, headache or visual disturbances   Complete by:  As directed    Call MD for:  extreme fatigue   Complete by:  As directed    Call MD for:  hives   Complete by:  As directed    Call MD for:  persistant dizziness or light-headedness   Complete by:  As directed    Call MD for:  persistant nausea and vomiting   Complete by:  As directed    Call MD for:  redness, tenderness, or signs of infection (pain, swelling, redness, odor or green/yellow discharge around incision site)   Complete by:  As directed    Call MD for:  severe uncontrolled pain   Complete by:  As directed    Call MD for:  temperature >100.4    Complete by:  As directed    Diet Carb Modified   Complete by:  As directed    Increase activity slowly   Complete by:  As directed      Allergies as of 09/03/2018      Reactions   Penicillins Other (See Comments)   Do not remember symptoms but was told DO NOT TAKE.    Glipizide Hives      Medication List    STOP taking these medications   acetaminophen 500 MG tablet Commonly known as:  TYLENOL     TAKE these medications   albuterol 108 (90 Base) MCG/ACT inhaler Commonly known as:  PROVENTIL HFA;VENTOLIN HFA Inhale 2 puffs into the lungs every 6 (six) hours as needed for wheezing or shortness of breath.   FISH OIL PO Take 1 capsule by mouth daily.   ibuprofen 800 MG tablet Commonly known as:  ADVIL,MOTRIN Take 1 tablet (800 mg total) by mouth every 8 (eight) hours.   metFORMIN 750 MG 24 hr tablet Commonly known as:  GLUCOPHAGE-XR Take 1 tablet (750 mg total) by mouth daily with breakfast.   oxyCODONE-acetaminophen 5-325 MG tablet Commonly known as:  PERCOCET/ROXICET Take 1 tablet by mouth every 4 (four) hours as needed for moderate pain.   VITAMIN D3 PO Take 1 capsule by mouth daily.      Follow-up Information    [facility] Follow up in 4 week(s).   Why:  Post op appt  with Tricia Benitez Tuesday Sep 08 2018 at 8:30 for staple removal Contact information: [address] [address] [phone]          Signed: Chancy Benitez 09/03/2018, 9:04 AM

## 2018-09-03 NOTE — Discharge Instructions (Signed)
Myomectomy, Care After °This sheet gives you information about how to care for yourself after your procedure. Your health care provider may also give you more specific instructions. If you have problems or questions, contact your health care provider. °What can I expect after the procedure? °After the procedure, it is common to have: °· Pain in your abdomen, especially at the incision areas. You will be given pain medicine to control the pain. °· Tiredness. This is a normal part of the recovery process. Your energy level will return to normal over the coming weeks. °· Vaginal bleeding. This is normal and will stop in the coming weeks. °· Constipation. °Recovery time from this procedure will depend on the type of procedure you had and your general overall health prior to the procedure. °Follow these instructions at home: °Medicines °· Take over-the-counter and prescription medicines only as told by your health care provider. °· Do not take aspirin because it can cause bleeding. °· If you were prescribed an antibiotic medicine, use it as told by your health care provider. Do not stop using the antibiotic even if you start to feel better. °· Do not drive or use heavy machinery while taking prescription pain medicine. °· Do not drink alcohol while taking prescription pain medicine. °Incision care ° °· Follow instructions from your health care provider about how to take care of any incisions. Make sure you: °? Wash your hands with soap and water before you change your bandage (dressing). If soap and water are not available, use hand sanitizer. °? Change your dressing as told by your health care provider. °? Leave stitches (sutures), skin glue, or adhesive strips in place. These skin closures may need to stay in place for 2 weeks or longer. If adhesive strip edges start to loosen and curl up, you may trim the loose edges. Do not remove adhesive strips completely unless your health care provider tells you to do  that. °· Check your incision areas every day for signs of infection. Check for: °? Redness, swelling, or pain. °? Fluid or blood. °? Warmth. °? Pus or a bad smell. °· Do not take baths, swim, or use a hot tub until your health care provider approves. Take showers as directed by your health care provider. °Activity °· Return to your normal activities as told by your health care provider. Ask your health care provider what activities are safe for you. °· Do not do activities that require a lot of effort until your health care provider says it is okay. °· Do not lift anything that is heavier than 15 lb (6.8 kg) until your health care provider says that it is safe. °· Do not douche, use tampons, or have sexual intercourse until your health care provider approves. °· Walk daily but take frequent rest breaks if you tire easily. °· Continue to practice deep breathing and coughing. If it hurts to cough, try holding a pillow against your belly as you cough. °· Do not drive until your health care provider approves. °General instructions °· To prevent or treat constipation while you are taking prescription pain medicine, your health care provider may recommend that you: °? Drink enough fluid to keep your urine clear or pale yellow. °? Take over-the-counter or prescription medicines. °? Eat foods that are high in fiber, such as fresh fruits and vegetables, whole grains, and beans. °? Limit foods that are high in fat and processed sugars, such as fried and sweet foods. °· Take your temperature twice a day   and write it down. If you develop a fever, this may be a sign that you have an infection. °· Do not drink alcohol. °· Have someone help you at home for 1 week or until you can do your own household activities. °· Keep all follow-up visits as told by your health care provider. This is important. °Contact a health care provider if: °· You have a fever. °· You have increasing abdominal pain that is not relieved with  medicine. °· You have nausea, vomiting, or diarrhea. °· You have pain when you urinate or you have blood in your urine. °· You have a rash on your body. °· You have pain or redness where your IV access tube was inserted. °· You have redness, swelling, or pain around an incision. °· You have fluid or blood coming from an incision. °· An incision feels warm to the touch. °· You have pus or a bad smell coming from an incision. °Get help right away if: °· You have weakness or light-headedness. °· You have pain, swelling, or redness in your legs. °· You have chest pain. °· You faint. °· You have shortness of breath. °· You have heavy vaginal bleeding. °· You have an incision that is opening up. °Summary °· Recovery time from this procedure will depend on the type of procedure you had and your general overall health prior to the procedure. °· If you were prescribed an antibiotic medicine, use it as told by your health care provider. Do not stop using the antibiotic even if you start to feel better. °· Do not douche, use tampons, or have sexual intercourse until your health care provider approves. °· Return to your normal activities as told by your health care provider. Ask your health care provider what activities are safe for you. °This information is not intended to replace advice given to you by your health care provider. Make sure you discuss any questions you have with your health care provider. °Document Released: 11/28/2010 Document Revised: 08/08/2016 Document Reviewed: 08/08/2016 °Elsevier Interactive Patient Education © 2019 [employer] ° °

## 2018-09-03 NOTE — Progress Notes (Signed)
Patient discharged home with family. Discharge teaching, home care, follow-up appts, prescriptions, and reasons to seek care discussed. Pt verbalized understanding.

## 2018-09-08 ENCOUNTER — Ambulatory Visit (INDEPENDENT_AMBULATORY_CARE_PROVIDER_SITE_OTHER): Payer: PRIVATE HEALTH INSURANCE | Admitting: Obstetrics and Gynecology

## 2018-09-08 ENCOUNTER — Encounter: Payer: Self-pay | Admitting: Obstetrics and Gynecology

## 2018-09-08 VITALS — BP 116/78 | HR 102 | Temp 98.3°F | Wt 169.0 lb

## 2018-09-08 DIAGNOSIS — Z9889 Other specified postprocedural states: Secondary | ICD-10-CM

## 2018-09-08 NOTE — Patient Instructions (Signed)
Myomectomy, Care After °This sheet gives you information about how to care for yourself after your procedure. Your health care provider may also give you more specific instructions. If you have problems or questions, contact your health care provider. °What can I expect after the procedure? °After the procedure, it is common to have: °· Pain in your abdomen, especially at the incision areas. You will be given pain medicine to control the pain. °· Tiredness. This is a normal part of the recovery process. Your energy level will return to normal over the coming weeks. °· Vaginal bleeding. This is normal and will stop in the coming weeks. °· Constipation. °Recovery time from this procedure will depend on the type of procedure you had and your general overall health prior to the procedure. °Follow these instructions at home: °Medicines °· Take over-the-counter and prescription medicines only as told by your health care provider. °· Do not take aspirin because it can cause bleeding. °· If you were prescribed an antibiotic medicine, use it as told by your health care provider. Do not stop using the antibiotic even if you start to feel better. °· Do not drive or use heavy machinery while taking prescription pain medicine. °· Do not drink alcohol while taking prescription pain medicine. °Incision care ° °· Follow instructions from your health care provider about how to take care of any incisions. Make sure you: °? Wash your hands with soap and water before you change your bandage (dressing). If soap and water are not available, use hand sanitizer. °? Change your dressing as told by your health care provider. °? Leave stitches (sutures), skin glue, or adhesive strips in place. These skin closures may need to stay in place for 2 weeks or longer. If adhesive strip edges start to loosen and curl up, you may trim the loose edges. Do not remove adhesive strips completely unless your health care provider tells you to do  that. °· Check your incision areas every day for signs of infection. Check for: °? Redness, swelling, or pain. °? Fluid or blood. °? Warmth. °? Pus or a bad smell. °· Do not take baths, swim, or use a hot tub until your health care provider approves. Take showers as directed by your health care provider. °Activity °· Return to your normal activities as told by your health care provider. Ask your health care provider what activities are safe for you. °· Do not do activities that require a lot of effort until your health care provider says it is okay. °· Do not lift anything that is heavier than 15 lb (6.8 kg) until your health care provider says that it is safe. °· Do not douche, use tampons, or have sexual intercourse until your health care provider approves. °· Walk daily but take frequent rest breaks if you tire easily. °· Continue to practice deep breathing and coughing. If it hurts to cough, try holding a pillow against your belly as you cough. °· Do not drive until your health care provider approves. °General instructions °· To prevent or treat constipation while you are taking prescription pain medicine, your health care provider may recommend that you: °? Drink enough fluid to keep your urine clear or pale yellow. °? Take over-the-counter or prescription medicines. °? Eat foods that are high in fiber, such as fresh fruits and vegetables, whole grains, and beans. °? Limit foods that are high in fat and processed sugars, such as fried and sweet foods. °· Take your temperature twice a day   and write it down. If you develop a fever, this may be a sign that you have an infection. °· Do not drink alcohol. °· Have someone help you at home for 1 week or until you can do your own household activities. °· Keep all follow-up visits as told by your health care provider. This is important. °Contact a health care provider if: °· You have a fever. °· You have increasing abdominal pain that is not relieved with  medicine. °· You have nausea, vomiting, or diarrhea. °· You have pain when you urinate or you have blood in your urine. °· You have a rash on your body. °· You have pain or redness where your IV access tube was inserted. °· You have redness, swelling, or pain around an incision. °· You have fluid or blood coming from an incision. °· An incision feels warm to the touch. °· You have pus or a bad smell coming from an incision. °Get help right away if: °· You have weakness or light-headedness. °· You have pain, swelling, or redness in your legs. °· You have chest pain. °· You faint. °· You have shortness of breath. °· You have heavy vaginal bleeding. °· You have an incision that is opening up. °Summary °· Recovery time from this procedure will depend on the type of procedure you had and your general overall health prior to the procedure. °· If you were prescribed an antibiotic medicine, use it as told by your health care provider. Do not stop using the antibiotic even if you start to feel better. °· Do not douche, use tampons, or have sexual intercourse until your health care provider approves. °· Return to your normal activities as told by your health care provider. Ask your health care provider what activities are safe for you. °This information is not intended to replace advice given to you by your health care provider. Make sure you discuss any questions you have with your health care provider. °Document Released: 11/28/2010 Document Revised: 08/08/2016 Document Reviewed: 08/08/2016 °Elsevier Interactive Patient Education © 2019 [employer] ° °

## 2018-09-08 NOTE — Progress Notes (Signed)
Patient ID: Tricia Benitez, female   DOB: 15-Aug-1985, 33 y.o.   MRN: [id number] [name] is here for staple removal. Pod # 7 from abd myomectomy. Overall doing well. Some constipation. Voiding without problems Tolerating diet.  Not taking any pain medications.  Pathology reviewed with pt.  PE AF VSS Lungs clear  Heart RRR Abd soft + BS incision CDI, staples removed and steri strips applied  A/P Post OP  Miralax and stool softener for bowels. Increase ADL's as tolerates. F/U in 3 weeks for post op visit.

## 2018-09-08 NOTE — Progress Notes (Signed)
RGYN pt per appt note presents for staple removal . Pt had Abdominal Myomectomy  Pt has no complaints  today

## 2018-09-10 ENCOUNTER — Telehealth: Payer: Self-pay

## 2018-09-10 ENCOUNTER — Inpatient Hospital Stay (HOSPITAL_COMMUNITY): Payer: PRIVATE HEALTH INSURANCE

## 2018-09-10 ENCOUNTER — Other Ambulatory Visit: Payer: Self-pay

## 2018-09-10 ENCOUNTER — Encounter (HOSPITAL_COMMUNITY): Payer: Self-pay

## 2018-09-10 ENCOUNTER — Inpatient Hospital Stay (HOSPITAL_COMMUNITY)
Admission: AD | Admit: 2018-09-10 | Discharge: 2018-09-16 | DRG: 863 | Disposition: A | Discharge status: Home / Self Care | Payer: PRIVATE HEALTH INSURANCE | Attending: Obstetrics and Gynecology | Admitting: Obstetrics and Gynecology

## 2018-09-10 DIAGNOSIS — E119 Type 2 diabetes mellitus without complications: Secondary | ICD-10-CM | POA: Diagnosis present

## 2018-09-10 DIAGNOSIS — Z888 Allergy status to other drugs, medicaments and biological substances status: Secondary | ICD-10-CM

## 2018-09-10 DIAGNOSIS — Z825 Family history of asthma and other chronic lower respiratory diseases: Secondary | ICD-10-CM

## 2018-09-10 DIAGNOSIS — R5082 Postprocedural fever: Secondary | ICD-10-CM | POA: Diagnosis present

## 2018-09-10 DIAGNOSIS — D509 Iron deficiency anemia, unspecified: Secondary | ICD-10-CM | POA: Diagnosis present

## 2018-09-10 DIAGNOSIS — Y838 Other surgical procedures as the cause of abnormal reaction of the patient, or of later complication, without mention of misadventure at the time of the procedure: Secondary | ICD-10-CM | POA: Diagnosis present

## 2018-09-10 DIAGNOSIS — Z880 Allergy status to penicillin: Secondary | ICD-10-CM

## 2018-09-10 DIAGNOSIS — Z79899 Other long term (current) drug therapy: Secondary | ICD-10-CM

## 2018-09-10 DIAGNOSIS — J45909 Unspecified asthma, uncomplicated: Secondary | ICD-10-CM | POA: Diagnosis present

## 2018-09-10 DIAGNOSIS — R000 Tachycardia, unspecified: Secondary | ICD-10-CM | POA: Diagnosis present

## 2018-09-10 DIAGNOSIS — T8141XA Infection following a procedure, superficial incisional surgical site, initial encounter: Principal | ICD-10-CM | POA: Diagnosis present

## 2018-09-10 DIAGNOSIS — Z9889 Other specified postprocedural states: Secondary | ICD-10-CM

## 2018-09-10 DIAGNOSIS — I809 Phlebitis and thrombophlebitis of unspecified site: Secondary | ICD-10-CM | POA: Diagnosis present

## 2018-09-10 DIAGNOSIS — R0602 Shortness of breath: Secondary | ICD-10-CM

## 2018-09-10 DIAGNOSIS — Z833 Family history of diabetes mellitus: Secondary | ICD-10-CM

## 2018-09-10 LAB — BASIC METABOLIC PANEL
ANION GAP: 9 (ref 5–15)
BUN: 8 mg/dL (ref 6–20)
CO2: 25 mmol/L (ref 22–32)
Calcium: 8.7 mg/dL — ABNORMAL LOW (ref 8.9–10.3)
Chloride: 98 mmol/L (ref 98–111)
Creatinine, Ser: 0.53 mg/dL (ref 0.44–1.00)
GFR calc non Af Amer: >60 mL/min (ref >60)
Glucose, Bld: 218 mg/dL — ABNORMAL HIGH (ref 70–99)
Potassium: 4.2 mmol/L (ref 3.5–5.1)
Sodium: 132 mmol/L — ABNORMAL LOW (ref 135–145)

## 2018-09-10 LAB — COMPREHENSIVE METABOLIC PANEL
ALBUMIN: 3.5 g/dL (ref 3.5–5.0)
ALT: 19 U/L (ref 0–44)
AST: 17 U/L (ref 15–41)
Alkaline Phosphatase: 73 U/L (ref 38–126)
Anion gap: 9 (ref 5–15)
BILIRUBIN TOTAL: 0.6 mg/dL (ref 0.3–1.2)
BUN: 7 mg/dL (ref 6–20)
CO2: 26 mmol/L (ref 22–32)
Calcium: 8.6 mg/dL — ABNORMAL LOW (ref 8.9–10.3)
Chloride: 99 mmol/L (ref 98–111)
Creatinine, Ser: 0.53 mg/dL (ref 0.44–1.00)
GFR calc Af Amer: >60 mL/min (ref >60)
GFR calc non Af Amer: >60 mL/min (ref >60)
Glucose, Bld: 174 mg/dL — ABNORMAL HIGH (ref 70–99)
Potassium: 3.7 mmol/L (ref 3.5–5.1)
Sodium: 134 mmol/L — ABNORMAL LOW (ref 135–145)
TOTAL PROTEIN: 7.5 g/dL (ref 6.5–8.1)

## 2018-09-10 LAB — CBC WITH DIFFERENTIAL/PLATELET
Basophils Absolute: 0 10*3/uL (ref 0.0–0.1)
Basophils Relative: 0 %
Eosinophils Absolute: 0 10*3/uL (ref 0.0–0.5)
Eosinophils Relative: 0 %
HCT: 27 % — ABNORMAL LOW (ref 36.0–46.0)
Hemoglobin: 8.4 g/dL — ABNORMAL LOW (ref 12.0–15.0)
LYMPHS ABS: 0.8 10*3/uL (ref 0.7–4.0)
LYMPHS PCT: 12 %
MCH: 23.3 pg — AB (ref 26.0–34.0)
MCHC: 31.1 g/dL (ref 30.0–36.0)
MCV: 75 fL — ABNORMAL LOW (ref 80.0–100.0)
MONOS PCT: 5 %
Monocytes Absolute: 0.4 10*3/uL (ref 0.1–1.0)
Neutro Abs: 5.7 10*3/uL (ref 1.7–7.7)
Neutrophils Relative %: 83 %
Platelets: 459 10*3/uL — ABNORMAL HIGH (ref 150–400)
RBC: 3.6 MIL/uL — ABNORMAL LOW (ref 3.87–5.11)
RDW: 14.7 % (ref 11.5–15.5)
WBC: 6.9 10*3/uL (ref 4.0–10.5)

## 2018-09-10 LAB — INFLUENZA PANEL BY PCR (TYPE A & B)
Influenza A By PCR: NEGATIVE
Influenza B By PCR: NEGATIVE

## 2018-09-10 LAB — URINALYSIS, ROUTINE W REFLEX MICROSCOPIC
Bilirubin Urine: NEGATIVE
Glucose, UA: NEGATIVE mg/dL
Ketones, ur: NEGATIVE mg/dL
Leukocytes,Ua: NEGATIVE
Nitrite: NEGATIVE
Protein, ur: NEGATIVE mg/dL
Specific Gravity, Urine: 1.01 (ref 1.005–1.030)
pH: 6 (ref 5.0–8.0)

## 2018-09-10 LAB — URINALYSIS, MICROSCOPIC (REFLEX)

## 2018-09-10 LAB — LACTIC ACID, PLASMA
LACTIC ACID, VENOUS: 2.1 mmol/L — AB (ref 0.5–1.9)
Lactic Acid, Venous: 1.3 mmol/L (ref 0.5–1.9)

## 2018-09-10 LAB — D-DIMER, QUANTITATIVE: D-Dimer, Quant: 2.57 ug/mL-FEU — ABNORMAL HIGH (ref 0.00–0.50)

## 2018-09-10 MED ORDER — LACTATED RINGERS IV BOLUS
1000.0000 mL | Freq: Once | INTRAVENOUS | Status: AC
  Administered 2018-09-10: 1000 mL via INTRAVENOUS

## 2018-09-10 MED ORDER — IOPAMIDOL (ISOVUE-370) INJECTION 76%
100.0000 mL | Freq: Once | INTRAVENOUS | Status: AC | PRN
  Administered 2018-09-10: 80 mL via INTRAVENOUS

## 2018-09-10 MED ORDER — SODIUM CHLORIDE 0.9 % IV BOLUS
1000.0000 mL | Freq: Once | INTRAVENOUS | Status: AC
  Administered 2018-09-10: 1000 mL via INTRAVENOUS

## 2018-09-10 MED ORDER — ACETAMINOPHEN 325 MG PO TABS
650.0000 mg | ORAL_TABLET | Freq: Four times a day (QID) | ORAL | Status: DC | PRN
  Administered 2018-09-10 – 2018-09-12 (×6): 650 mg via ORAL
  Filled 2018-09-10 (×6): qty 2

## 2018-09-10 MED ORDER — LACTATED RINGERS IV SOLN
INTRAVENOUS | Status: DC
  Administered 2018-09-10 – 2018-09-13 (×5): via INTRAVENOUS

## 2018-09-10 MED ORDER — PRENATAL MULTIVITAMIN CH
1.0000 | ORAL_TABLET | Freq: Every day | ORAL | Status: DC
  Administered 2018-09-12 – 2018-09-14 (×3): 1 via ORAL
  Filled 2018-09-10 (×5): qty 1

## 2018-09-10 MED ORDER — ONDANSETRON HCL 4 MG/2ML IJ SOLN
4.0000 mg | Freq: Once | INTRAMUSCULAR | Status: AC
  Administered 2018-09-10: 4 mg via INTRAVENOUS
  Filled 2018-09-10: qty 2

## 2018-09-10 MED ORDER — ACETAMINOPHEN 500 MG PO TABS
1000.0000 mg | ORAL_TABLET | Freq: Once | ORAL | Status: AC
  Administered 2018-09-10: 1000 mg via ORAL
  Filled 2018-09-10: qty 2

## 2018-09-10 MED ORDER — ALBUTEROL SULFATE (2.5 MG/3ML) 0.083% IN NEBU
3.0000 mL | INHALATION_SOLUTION | Freq: Four times a day (QID) | RESPIRATORY_TRACT | Status: DC | PRN
  Administered 2018-09-10 – 2018-09-12 (×3): 3 mL via RESPIRATORY_TRACT
  Filled 2018-09-10 (×2): qty 3

## 2018-09-10 MED ORDER — OXYCODONE-ACETAMINOPHEN 5-325 MG PO TABS
1.0000 | ORAL_TABLET | ORAL | Status: DC | PRN
  Filled 2018-09-10: qty 2

## 2018-09-10 MED ORDER — IPRATROPIUM-ALBUTEROL 0.5-2.5 (3) MG/3ML IN SOLN
3.0000 mL | Freq: Once | RESPIRATORY_TRACT | Status: AC
  Administered 2018-09-10: 3 mL via RESPIRATORY_TRACT
  Filled 2018-09-10: qty 3

## 2018-09-10 MED ORDER — IBUPROFEN 600 MG PO TABS
600.0000 mg | ORAL_TABLET | Freq: Four times a day (QID) | ORAL | Status: DC | PRN
  Administered 2018-09-11 – 2018-09-13 (×4): 600 mg via ORAL
  Filled 2018-09-10 (×4): qty 1

## 2018-09-10 MED ORDER — METRONIDAZOLE IN NACL 5-0.79 MG/ML-% IV SOLN
500.0000 mg | Freq: Four times a day (QID) | INTRAVENOUS | Status: DC
  Administered 2018-09-11 (×2): 500 mg via INTRAVENOUS
  Filled 2018-09-10 (×3): qty 100

## 2018-09-10 MED ORDER — SODIUM CHLORIDE 0.9 % IV SOLN
100.0000 mg | Freq: Two times a day (BID) | INTRAVENOUS | Status: DC
  Administered 2018-09-10 – 2018-09-11 (×2): 100 mg via INTRAVENOUS
  Filled 2018-09-10 (×2): qty 100

## 2018-09-10 MED ORDER — DOCUSATE SODIUM 100 MG PO CAPS
100.0000 mg | ORAL_CAPSULE | Freq: Two times a day (BID) | ORAL | Status: DC
  Administered 2018-09-10 – 2018-09-16 (×6): 100 mg via ORAL
  Filled 2018-09-10 (×10): qty 1

## 2018-09-10 MED ORDER — IOPAMIDOL (ISOVUE-300) INJECTION 61%
100.0000 mL | Freq: Once | INTRAVENOUS | Status: AC | PRN
  Administered 2018-09-10: 100 mL via INTRAVENOUS

## 2018-09-10 NOTE — Telephone Encounter (Signed)
TC from pt c/o fever last night and this morning.  She said last night it was over 100F. Fever subsided with Tylenol 500 mg; however, fever returned this morning 100.7 and she is experiencing some SOB. Consulted with Dr. Rip Harbour, pt to present to [facility].

## 2018-09-10 NOTE — H&P (Signed)
History and Physical  Tricia Benitez is a 33 y.o. non pregnant female who is being admitted for surgical site infection. She is post op day 9 from a myomectomy for uterine fibroids.  Reports daily fever/chills since her surgery. Her temps have been 100-100.5. Highest was last night. Symptoms recently have included cough & DOE. She does have hx of asthma. Symptoms not improved with tylenol & albuterol inhaler.  Some nausea, but no vomiting. Denies diarrhea, constipation, abnormal discharge, or abdominal pain.  Had extensive evaluation in [facility] which included labs, chest xray, abd/pelvis CT, & CTA. Pulmonary embolism, influenza, & pneumonia were ruled out.   Past Medical History:  Diagnosis Date  . Anemia   . Asthma   . Diabetes mellitus without complication (HCC)    Type II  . UTI (urinary tract infection)     Past Surgical History:  Procedure Laterality Date  . BREAST BIOPSY Left 2015   cyst removed.   Marland Kitchen MOUTH SURGERY     teeth ext   . MYOMECTOMY N/A 09/01/2018   Procedure: ABDOMINAL MYOMECTOMY;  Surgeon: Chancy Milroy, MD;  Location: [facility];  Service: Gynecology;  Laterality: N/A;  . TONSILLECTOMY      OB History  Gravida Para Term Preterm AB Living  1       1    SAB TAB Ectopic Multiple Live Births    1          # Outcome Date GA Lbr Len/2nd Weight Sex Delivery Anes PTL Lv  1 TAB 07/23/07            Social History   Socioeconomic History  . Marital status: Single    Spouse name: Not on file  . Number of children: 0  . Years of education: Not on file  . Highest education level: Not on file  Occupational History  . Not on file  Social Needs  . Financial resource strain: Not on file  . Food insecurity:    Worry: Not on file    Inability: Not on file  . Transportation needs:    Medical: Not on file    Non-medical: Not on file  Tobacco Use  . Smoking status: Never Smoker  . Smokeless tobacco: Never Used  Substance and Sexual Activity  . Alcohol use: Yes     Comment: social  . Drug use: Never  . Sexual activity: Yes    Birth control/protection: Other-see comments    Comment: female partner  Lifestyle  . Physical activity:    Days per week: Not on file    Minutes per session: Not on file  . Stress: Not on file  Relationships  . Social connections:    Talks on phone: Not on file    Gets together: Not on file    Attends religious service: Not on file    Active member of club or organization: Not on file    Attends meetings of clubs or organizations: Not on file    Relationship status: Not on file  Other Topics Concern  . Not on file  Social History Narrative  . Not on file    Family History  Problem Relation Age of Onset  . Fibroids Mother   . Asthma Mother   . Diabetes Father   . Kidney disease Father   . Alcohol abuse Father   . Diabetes Paternal Grandmother   . Hyperlipidemia Paternal Grandmother   . Hypertension Paternal Grandmother   . Alcohol abuse Maternal Grandmother  Medications Prior to Admission  Medication Sig Dispense Refill Last Dose  . albuterol (PROVENTIL HFA;VENTOLIN HFA) 108 (90 Base) MCG/ACT inhaler Inhale 2 puffs into the lungs every 6 (six) hours as needed for wheezing or shortness of breath.   Taking  . Cholecalciferol (VITAMIN D3 PO) Take 1 capsule by mouth daily.   Taking  . metFORMIN (GLUCOPHAGE-XR) 750 MG 24 hr tablet Take 1 tablet (750 mg total) by mouth daily with breakfast. (Patient not taking: Reported on 08/11/2018) 90 tablet 1 Not Taking  . Omega-3 Fatty Acids (FISH OIL PO) Take 1 capsule by mouth daily.   Taking    Allergies  Allergen Reactions  . Penicillins Other (See Comments)    Do not remember symptoms but was told DO NOT TAKE.   Marland Kitchen Glipizide Hives    Review of Systems: Negative except for what is mentioned in HPI.  Physical Exam: Patient Vitals for the past 24 hrs:  BP Temp Temp src Pulse Resp SpO2 Height Weight  09/10/18 1923 120/66 99.8 F (37.7 C) Oral (!) 104 - 100 % - -   09/10/18 1605 113/69 (!) 100.4 F (38 C) Oral 94 18 100 % - -  09/10/18 1327 118/60 (!) 102.9 F (39.4 C) Oral (!) 110 12 100 % - -  09/10/18 1217 - - - - - 100 % - -  09/10/18 1100 126/72 (!) 101.5 F (38.6 C) Oral 98 18 100 % 5\' 5"  (1.651 m) 74.8 kg     CONSTITUTIONAL: Well-developed, well-nourished female in no acute distress.  HENT:  Normocephalic, atraumatic, External right and left ear normal. Oropharynx is clear and moist EYES: Conjunctivae and EOM are normal. Pupils are equal, round, and reactive to light. No scleral icterus.  NECK: Normal range of motion, supple, no masses SKIN: Skin is warm and dry. No rash noted. Not diaphoretic. No erythema. No pallor. NEUROLOGIC: Alert and oriented to person, place, and time. Normal reflexes, muscle tone coordination. No cranial nerve deficit noted. PSYCHIATRIC: Normal mood and affect. Normal behavior. Normal judgment and thought content. CARDIOVASCULAR: tachycardia noted, regular rhythm RESPIRATORY: Diminished breath sounds in RLL. Effort normal, no problems with respiration noted after breathing tx ABDOMEN: Abdominal incision well approximated without erythema or drainage. Area surrounding incision firm & tender, specifically right side. BS present x 4.  MUSCULOSKELETAL: Normal range of motion. No edema and no tenderness. 2+ distal pulses.  Pertinent Labs/Studies:   Results for orders placed or performed during the hospital encounter of 09/10/18 (from the past 24 hour(s))  Urinalysis, Routine w reflex microscopic     Status: Abnormal   Collection Time: 09/10/18 11:21 AM  Result Value Ref Range   Color, Urine YELLOW YELLOW   APPearance CLEAR CLEAR   Specific Gravity, Urine 1.010 1.005 - 1.030   pH 6.0 5.0 - 8.0   Glucose, UA NEGATIVE NEGATIVE mg/dL   Hgb urine dipstick SMALL (A) NEGATIVE   Bilirubin Urine NEGATIVE NEGATIVE   Ketones, ur NEGATIVE NEGATIVE mg/dL   Protein, ur NEGATIVE NEGATIVE mg/dL   Nitrite NEGATIVE NEGATIVE    Leukocytes,Ua NEGATIVE NEGATIVE  Urinalysis, Microscopic (reflex)     Status: Abnormal   Collection Time: 09/10/18 11:21 AM  Result Value Ref Range   RBC / HPF 0-5 0 - 5 RBC/hpf   WBC, UA 0-5 0 - 5 WBC/hpf   Bacteria, UA FEW (A) NONE SEEN   Squamous Epithelial / LPF 0-5 0 - 5  Influenza panel by PCR (type A & B)  Status: None   Collection Time: 09/10/18 11:33 AM  Result Value Ref Range   Influenza A By PCR NEGATIVE NEGATIVE   Influenza B By PCR NEGATIVE NEGATIVE  CBC with Differential/Platelet     Status: Abnormal   Collection Time: 09/10/18 12:27 PM  Result Value Ref Range   WBC 6.9 4.0 - 10.5 K/uL   RBC 3.60 (L) 3.87 - 5.11 MIL/uL   Hemoglobin 8.4 (L) 12.0 - 15.0 g/dL   HCT 27.0 (L) 36.0 - 46.0 %   MCV 75.0 (L) 80.0 - 100.0 fL   MCH 23.3 (L) 26.0 - 34.0 pg   MCHC 31.1 30.0 - 36.0 g/dL   RDW 14.7 11.5 - 15.5 %   Platelets 459 (H) 150 - 400 K/uL   Neutrophils Relative % 83 %   Neutro Abs 5.7 1.7 - 7.7 K/uL   Lymphocytes Relative 12 %   Lymphs Abs 0.8 0.7 - 4.0 K/uL   Monocytes Relative 5 %   Monocytes Absolute 0.4 0.1 - 1.0 K/uL   Eosinophils Relative 0 %   Eosinophils Absolute 0.0 0.0 - 0.5 K/uL   Basophils Relative 0 %   Basophils Absolute 0.0 0.0 - 0.1 K/uL  Lactic acid, plasma     Status: Abnormal   Collection Time: 09/10/18 12:27 PM  Result Value Ref Range   Lactic Acid, Venous 2.1 (HH) 0.5 - 1.9 mmol/L  D-dimer, quantitative (not at [facility])     Status: Abnormal   Collection Time: 09/10/18 12:27 PM  Result Value Ref Range   D-Dimer, Quant 2.57 (H) 0.00 - 0.50 ug/mL-FEU  Basic metabolic panel     Status: Abnormal   Collection Time: 09/10/18 12:27 PM  Result Value Ref Range   Sodium 132 (L) 135 - 145 mmol/L   Potassium 4.2 3.5 - 5.1 mmol/L   Chloride 98 98 - 111 mmol/L   CO2 25 22 - 32 mmol/L   Glucose, Bld 218 (H) 70 - 99 mg/dL   BUN 8 6 - 20 mg/dL   Creatinine, Ser 0.53 0.44 - 1.00 mg/dL   Calcium 8.7 (L) 8.9 - 10.3 mg/dL   GFR calc non Af Amer >60 >60  mL/min   GFR calc Af Amer >60 >60 mL/min   Anion gap 9 5 - 15  Lactic acid, plasma     Status: None   Collection Time: 09/10/18  3:24 PM  Result Value Ref Range   Lactic Acid, Venous 1.3 0.5 - 1.9 mmol/L  Comprehensive metabolic panel     Status: Abnormal   Collection Time: 09/10/18  5:08 PM  Result Value Ref Range   Sodium 134 (L) 135 - 145 mmol/L   Potassium 3.7 3.5 - 5.1 mmol/L   Chloride 99 98 - 111 mmol/L   CO2 26 22 - 32 mmol/L   Glucose, Bld 174 (H) 70 - 99 mg/dL   BUN 7 6 - 20 mg/dL   Creatinine, Ser 0.53 0.44 - 1.00 mg/dL   Calcium 8.6 (L) 8.9 - 10.3 mg/dL   Total Protein 7.5 6.5 - 8.1 g/dL   Albumin 3.5 3.5 - 5.0 g/dL   AST 17 15 - 41 U/L   ALT 19 0 - 44 U/L   Alkaline Phosphatase 73 38 - 126 U/L   Total Bilirubin 0.6 0.3 - 1.2 mg/dL   GFR calc non Af Amer >60 >60 mL/min   GFR calc Af Amer >60 >60 mL/min   Anion gap 9 5 - 15  Type and screen  Status: None   Collection Time: 09/10/18  5:08 PM  Result Value Ref Range   ABO/RH(D) A POS    Antibody Screen NEG    Sample Expiration      09/13/2018 Performed at [facility], [address] [place], [place] [address]    Dg Chest 2 View  Result Date: 09/10/2018 CLINICAL DATA:  Cough, fever, shortness of breath, history asthma, type II diabetes mellitus EXAM: CHEST - 2 VIEW COMPARISON:  None FINDINGS: Normal heart size, mediastinal contours, and pulmonary vascularity. Lungs clear. No pleural effusion or pneumothorax. Bones unremarkable. IMPRESSION: Normal exam. Electronically Signed   By: Lavonia Dana M.D.   On: 09/10/2018 13:16   Ct Angio Chest Pe W And/or Wo Contrast  Result Date: 09/10/2018 CLINICAL DATA:  33 year old female with acute shortness of breath and fever since last night. Status post abdominal surgery, myomectomy on 07/02/2019. EXAM: CT ANGIOGRAPHY CHEST WITH CONTRAST TECHNIQUE: Multidetector CT imaging of the chest was performed using the standard protocol during bolus administration of intravenous  contrast. Multiplanar CT image reconstructions and MIPs were obtained to evaluate the vascular anatomy. CONTRAST:  29mL ISOVUE-370 IOPAMIDOL (ISOVUE-370) INJECTION 76% COMPARISON:  Chest radiographs earlier today. CT Abdomen and Pelvis earlier today. FINDINGS: Cardiovascular: Good contrast bolus timing in the pulmonary arterial tree. Mild respiratory motion. No focal filling defect identified in the pulmonary arteries to suggest acute pulmonary embolism. No calcified coronary artery atherosclerosis is evident. Negative visible aorta. No cardiomegaly or pericardial effusion. Mediastinum/Nodes: Negative. No lymphadenopathy. Lungs/Pleura: Major airways are patent. Both lungs appear clear aside from minor dependent atelectasis. No pleural effusion. Upper Abdomen: Small volume pneumoperitoneum (series 6, image 109). Negative visible liver, gallbladder, spleen, pancreas, adrenal glands and bowel in the upper abdomen. Oral contrast from earlier has reached the hepatic flexure. Excreted IV contrast from the earlier CT Abdomen and Pelvis visible in the left renal collecting system. Musculoskeletal: Negative. Review of the MIP images confirms the above findings. IMPRESSION: 1. Negative for acute pulmonary embolus. Negative Chest CTA. 2. Trace pneumoperitoneum in the upper abdomen compatible with recent abdominal surgery. Electronically Signed   By: Genevie Ann M.D.   On: 09/10/2018 19:32   Ct Abdomen Pelvis W Contrast  Result Date: 09/10/2018 CLINICAL DATA:  Myomectomy 09/01/2018. Fever, cough and chest pain beginning last night. EXAM: CT ABDOMEN AND PELVIS WITH CONTRAST TECHNIQUE: Multidetector CT imaging of the abdomen and pelvis was performed using the standard protocol following bolus administration of intravenous contrast. CONTRAST:  175mL ISOVUE-300 IOPAMIDOL (ISOVUE-300) INJECTION 61% COMPARISON:  04/23/2018 FINDINGS: Lower chest: Normal Hepatobiliary: Normal Pancreas: Normal Spleen: Normal Adrenals/Urinary Tract:  Adrenal glands are normal. Renal parenchyma is normal. Mild fullness of the renal collecting systems and ureters, probably due to relative extrinsic compression from the enlarged uterus. Bladder appears normal. Stomach/Bowel: No acute bowel finding. Fairly large amount of fecal matter within the colon. Vascular/Lymphatic: Normal Reproductive: Markedly enlarged uterus with multiple leiomyomas. Uterus measures 17 x 9 x 13 cm. Evidence of a recent myomectomy in the lower uterine segment with some air bubbles presumed secondary to hemostatic material. One could not rule out infection at the surgical site, but there is no specific positive finding to suggest that. Both ovaries are seen and appear to contain multiple small cysts. Question polycystic ovary disease. Other: No free fluid or air. Musculoskeletal: Negative IMPRESSION: No definite complication seen following myomectomy. Patient continues to have an enlarged uterus with multiple leiomyomas. The largest lesions have been resected. There are some air bubbles in the  lower uterine segment region presumed to represent hemostatic material. I can not rule out postoperative infection but do not specifically favor that. Electronically Signed   By: Nelson Chimes M.D.   On: 09/10/2018 15:55     Assessment : Tricia Benitez is a 33 y.o. non pregnant female being admitted for surgical site infection  Plan: Admit to 3rd floor for antibiotics  Jorje Guild, NP

## 2018-09-10 NOTE — MAU Note (Signed)
Pt reports she has been SOB since last night and had a fever of 105 last night today is 100.0. Had Myomectomy on 2/11.

## 2018-09-10 NOTE — MAU Provider Note (Addendum)
History     CSN: [id number]  Arrival date and time: 09/10/18 1045   First Provider Initiated Contact with Patient 09/10/18 1127      Chief Complaint  Patient presents with  . Shortness of Breath  . Fever   HPI  Patient presents with a 1 day history of shortness of breath and fever.  She had a myomectomy for uterine leiomyomata on 09/01/2018.  She tolerated the procedure well and was seen in clinic on 09/08/2018 for staple removal.  Wound is healing well.  She did note a few episodes of dizziness throughout the week where she "needed to sit down."  She describes the sensation as lightheadedness.  Yesterday, the patient began feeling shortness of breath with activity.  Her symptoms progressively worsened throughout the day. Now she is visibly short of breath in conversations.  She has a history of asthma, and used her albuterol rescue inhaler yesterday for her symptoms.  She had mild relief with this treatment.  She reports that she has been using her incentive spirometer as advised following surgery and notes decreased vital capacity over the past 2 days.    In addition, she notes a new onset cough with clear sputum production.  She denies rhinorrhea, sore throat, or chest pain, but does note pressure in the left ear.  She also denies joint pain and edema in her legs, and notes that she has been ambulating throughout the day.   She reports that the fever has been running between 101F and 105F on home readings.  She took acetaminophen 500 mg at 10:30 PM yesterday and 8:00 AM for no relief.    She states that she has not had a flu shot.  She denies sick contacts.     Past Medical History:  Diagnosis Date  . Anemia   . Asthma   . Diabetes mellitus without complication (HCC)    Type II  . UTI (urinary tract infection)     Past Surgical History:  Procedure Laterality Date  . BREAST BIOPSY Left 2015   cyst removed.   Marland Kitchen MOUTH SURGERY     teeth ext   . MYOMECTOMY N/A 09/01/2018   Procedure: ABDOMINAL MYOMECTOMY;  Surgeon: Chancy Milroy, MD;  Location: [facility];  Service: Gynecology;  Laterality: N/A;  . TONSILLECTOMY      Family History  Problem Relation Age of Onset  . Fibroids Mother   . Asthma Mother   . Diabetes Father   . Kidney disease Father   . Alcohol abuse Father   . Diabetes Paternal Grandmother   . Hyperlipidemia Paternal Grandmother   . Hypertension Paternal Grandmother   . Alcohol abuse Maternal Grandmother     Social History   Tobacco Use  . Smoking status: Never Smoker  . Smokeless tobacco: Never Used  Substance Use Topics  . Alcohol use: Yes    Comment: social  . Drug use: Never    Allergies:  Allergies  Allergen Reactions  . Penicillins Other (See Comments)    Do not remember symptoms but was told DO NOT TAKE.   Marland Kitchen Glipizide Hives    Medications Prior to Admission  Medication Sig Dispense Refill Last Dose  . albuterol (PROVENTIL HFA;VENTOLIN HFA) 108 (90 Base) MCG/ACT inhaler Inhale 2 puffs into the lungs every 6 (six) hours as needed for wheezing or shortness of breath.   Taking  . Cholecalciferol (VITAMIN D3 PO) Take 1 capsule by mouth daily.   Taking  . metFORMIN (GLUCOPHAGE-XR) 750  MG 24 hr tablet Take 1 tablet (750 mg total) by mouth daily with breakfast. (Patient not taking: Reported on 08/11/2018) 90 tablet 1 Not Taking  . Omega-3 Fatty Acids (FISH OIL PO) Take 1 capsule by mouth daily.   Taking    Review of Systems  Constitutional: Positive for appetite change (decreased appetite today), fatigue and fever.  HENT: Positive for ear pain (Left). Negative for rhinorrhea, sinus pressure, sore throat and trouble swallowing.   Respiratory: Positive for cough, chest tightness and shortness of breath.   Gastrointestinal: Negative for constipation and diarrhea.  Genitourinary: Negative for vaginal bleeding and vaginal discharge.  Neurological: Positive for dizziness and light-headedness.   Physical Exam   Patient Vitals for  the past 24 hrs:  BP Temp Temp src Pulse Resp SpO2 Height Weight  09/10/18 1923 120/66 99.8 F (37.7 C) Oral (!) 104 - 100 % - -  09/10/18 1605 113/69 (!) 100.4 F (38 C) Oral 94 18 100 % - -  09/10/18 1327 118/60 (!) 102.9 F (39.4 C) Oral (!) 110 12 100 % - -  09/10/18 1217 - - - - - 100 % - -  09/10/18 1100 126/72 (!) 101.5 F (38.6 C) Oral 98 18 100 % 5\' 5"  (1.651 m) 74.8 kg     Physical Exam  Constitutional: She is oriented to person, place, and time. She appears well-developed and well-nourished. She appears distressed (mild).  HENT:  Right Ear: Tympanic membrane and external ear normal.  Left Ear: External ear normal. Tympanic membrane is bulging. Tympanic membrane is not erythematous.  Ears:  Nose: Nose normal. No mucosal edema or rhinorrhea.  Mouth/Throat: Uvula is midline and oropharynx is clear and moist.  Cardiovascular: Normal rate, regular rhythm and normal heart sounds. Exam reveals no gallop and no friction rub.  No murmur heard. Respiratory: She has decreased breath sounds in the left lower field. She has rales in the right upper field, the right middle field and the left upper field.  GI: Soft. Bowel sounds are normal.  Musculoskeletal:        General: No edema.  Lymphadenopathy:    She has cervical adenopathy.  Neurological: She is alert and oriented to person, place, and time. She has normal reflexes.  Skin: Skin is warm and dry. She is not diaphoretic.  Psychiatric: Her behavior is normal. Judgment and thought content normal.    MAU Course  Procedures Results for orders placed or performed during the hospital encounter of 09/10/18 (from the past 24 hour(s))  Urinalysis, Routine w reflex microscopic     Status: Abnormal   Collection Time: 09/10/18 11:21 AM  Result Value Ref Range   Color, Urine YELLOW YELLOW   APPearance CLEAR CLEAR   Specific Gravity, Urine 1.010 1.005 - 1.030   pH 6.0 5.0 - 8.0   Glucose, UA NEGATIVE NEGATIVE mg/dL   Hgb urine  dipstick SMALL (A) NEGATIVE   Bilirubin Urine NEGATIVE NEGATIVE   Ketones, ur NEGATIVE NEGATIVE mg/dL   Protein, ur NEGATIVE NEGATIVE mg/dL   Nitrite NEGATIVE NEGATIVE   Leukocytes,Ua NEGATIVE NEGATIVE  Urinalysis, Microscopic (reflex)     Status: Abnormal   Collection Time: 09/10/18 11:21 AM  Result Value Ref Range   RBC / HPF 0-5 0 - 5 RBC/hpf   WBC, UA 0-5 0 - 5 WBC/hpf   Bacteria, UA FEW (A) NONE SEEN   Squamous Epithelial / LPF 0-5 0 - 5  Influenza panel by PCR (type A & B)  Status: None   Collection Time: 09/10/18 11:33 AM  Result Value Ref Range   Influenza A By PCR NEGATIVE NEGATIVE   Influenza B By PCR NEGATIVE NEGATIVE  CBC with Differential/Platelet     Status: Abnormal   Collection Time: 09/10/18 12:27 PM  Result Value Ref Range   WBC 6.9 4.0 - 10.5 K/uL   RBC 3.60 (L) 3.87 - 5.11 MIL/uL   Hemoglobin 8.4 (L) 12.0 - 15.0 g/dL   HCT 27.0 (L) 36.0 - 46.0 %   MCV 75.0 (L) 80.0 - 100.0 fL   MCH 23.3 (L) 26.0 - 34.0 pg   MCHC 31.1 30.0 - 36.0 g/dL   RDW 14.7 11.5 - 15.5 %   Platelets 459 (H) 150 - 400 K/uL   Neutrophils Relative % 83 %   Neutro Abs 5.7 1.7 - 7.7 K/uL   Lymphocytes Relative 12 %   Lymphs Abs 0.8 0.7 - 4.0 K/uL   Monocytes Relative 5 %   Monocytes Absolute 0.4 0.1 - 1.0 K/uL   Eosinophils Relative 0 %   Eosinophils Absolute 0.0 0.0 - 0.5 K/uL   Basophils Relative 0 %   Basophils Absolute 0.0 0.0 - 0.1 K/uL  Lactic acid, plasma     Status: Abnormal   Collection Time: 09/10/18 12:27 PM  Result Value Ref Range   Lactic Acid, Venous 2.1 (HH) 0.5 - 1.9 mmol/L  D-dimer, quantitative (not at [facility])     Status: Abnormal   Collection Time: 09/10/18 12:27 PM  Result Value Ref Range   D-Dimer, Quant 2.57 (H) 0.00 - 0.50 ug/mL-FEU  Basic metabolic panel     Status: Abnormal   Collection Time: 09/10/18 12:27 PM  Result Value Ref Range   Sodium 132 (L) 135 - 145 mmol/L   Potassium 4.2 3.5 - 5.1 mmol/L   Chloride 98 98 - 111 mmol/L   CO2 25 22 - 32 mmol/L    Glucose, Bld 218 (H) 70 - 99 mg/dL   BUN 8 6 - 20 mg/dL   Creatinine, Ser 0.53 0.44 - 1.00 mg/dL   Calcium 8.7 (L) 8.9 - 10.3 mg/dL   GFR calc non Af Amer >60 >60 mL/min   GFR calc Af Amer >60 >60 mL/min   Anion gap 9 5 - 15  Lactic acid, plasma     Status: None   Collection Time: 09/10/18  3:24 PM  Result Value Ref Range   Lactic Acid, Venous 1.3 0.5 - 1.9 mmol/L  Comprehensive metabolic panel     Status: Abnormal   Collection Time: 09/10/18  5:08 PM  Result Value Ref Range   Sodium 134 (L) 135 - 145 mmol/L   Potassium 3.7 3.5 - 5.1 mmol/L   Chloride 99 98 - 111 mmol/L   CO2 26 22 - 32 mmol/L   Glucose, Bld 174 (H) 70 - 99 mg/dL   BUN 7 6 - 20 mg/dL   Creatinine, Ser 0.53 0.44 - 1.00 mg/dL   Calcium 8.6 (L) 8.9 - 10.3 mg/dL   Total Protein 7.5 6.5 - 8.1 g/dL   Albumin 3.5 3.5 - 5.0 g/dL   AST 17 15 - 41 U/L   ALT 19 0 - 44 U/L   Alkaline Phosphatase 73 38 - 126 U/L   Total Bilirubin 0.6 0.3 - 1.2 mg/dL   GFR calc non Af Amer >60 >60 mL/min   GFR calc Af Amer >60 >60 mL/min   Anion gap 9 5 - 15  Type and screen  Status: None   Collection Time: 09/10/18  5:08 PM  Result Value Ref Range   ABO/RH(D) A POS    Antibody Screen NEG    Sample Expiration      09/13/2018 Performed at [facility], [address], [place], [place] [address]    Dg Chest 2 View  Result Date: 09/10/2018 CLINICAL DATA:  Cough, fever, shortness of breath, history asthma, type II diabetes mellitus EXAM: CHEST - 2 VIEW COMPARISON:  None FINDINGS: Normal heart size, mediastinal contours, and pulmonary vascularity. Lungs clear. No pleural effusion or pneumothorax. Bones unremarkable. IMPRESSION: Normal exam. Electronically Signed   By: Lavonia Dana M.D.   On: 09/10/2018 13:16   Ct Angio Chest Pe W And/or Wo Contrast  Result Date: 09/10/2018 CLINICAL DATA:  33 year old female with acute shortness of breath and fever since last night. Status post abdominal surgery, myomectomy on 07/02/2019. EXAM: CT  ANGIOGRAPHY CHEST WITH CONTRAST TECHNIQUE: Multidetector CT imaging of the chest was performed using the standard protocol during bolus administration of intravenous contrast. Multiplanar CT image reconstructions and MIPs were obtained to evaluate the vascular anatomy. CONTRAST:  8mL ISOVUE-370 IOPAMIDOL (ISOVUE-370) INJECTION 76% COMPARISON:  Chest radiographs earlier today. CT Abdomen and Pelvis earlier today. FINDINGS: Cardiovascular: Good contrast bolus timing in the pulmonary arterial tree. Mild respiratory motion. No focal filling defect identified in the pulmonary arteries to suggest acute pulmonary embolism. No calcified coronary artery atherosclerosis is evident. Negative visible aorta. No cardiomegaly or pericardial effusion. Mediastinum/Nodes: Negative. No lymphadenopathy. Lungs/Pleura: Major airways are patent. Both lungs appear clear aside from minor dependent atelectasis. No pleural effusion. Upper Abdomen: Small volume pneumoperitoneum (series 6, image 109). Negative visible liver, gallbladder, spleen, pancreas, adrenal glands and bowel in the upper abdomen. Oral contrast from earlier has reached the hepatic flexure. Excreted IV contrast from the earlier CT Abdomen and Pelvis visible in the left renal collecting system. Musculoskeletal: Negative. Review of the MIP images confirms the above findings. IMPRESSION: 1. Negative for acute pulmonary embolus. Negative Chest CTA. 2. Trace pneumoperitoneum in the upper abdomen compatible with recent abdominal surgery. Electronically Signed   By: Genevie Ann M.D.   On: 09/10/2018 19:32   Ct Abdomen Pelvis W Contrast  Result Date: 09/10/2018 CLINICAL DATA:  Myomectomy 09/01/2018. Fever, cough and chest pain beginning last night. EXAM: CT ABDOMEN AND PELVIS WITH CONTRAST TECHNIQUE: Multidetector CT imaging of the abdomen and pelvis was performed using the standard protocol following bolus administration of intravenous contrast. CONTRAST:  138mL ISOVUE-300  IOPAMIDOL (ISOVUE-300) INJECTION 61% COMPARISON:  04/23/2018 FINDINGS: Lower chest: Normal Hepatobiliary: Normal Pancreas: Normal Spleen: Normal Adrenals/Urinary Tract: Adrenal glands are normal. Renal parenchyma is normal. Mild fullness of the renal collecting systems and ureters, probably due to relative extrinsic compression from the enlarged uterus. Bladder appears normal. Stomach/Bowel: No acute bowel finding. Fairly large amount of fecal matter within the colon. Vascular/Lymphatic: Normal Reproductive: Markedly enlarged uterus with multiple leiomyomas. Uterus measures 17 x 9 x 13 cm. Evidence of a recent myomectomy in the lower uterine segment with some air bubbles presumed secondary to hemostatic material. One could not rule out infection at the surgical site, but there is no specific positive finding to suggest that. Both ovaries are seen and appear to contain multiple small cysts. Question polycystic ovary disease. Other: No free fluid or air. Musculoskeletal: Negative IMPRESSION: No definite complication seen following myomectomy. Patient continues to have an enlarged uterus with multiple leiomyomas. The largest lesions have been resected. There are some air bubbles in the  lower uterine segment region presumed to represent hemostatic material. I can not rule out postoperative infection but do not specifically favor that. Electronically Signed   By: Nelson Chimes M.D.   On: 09/10/2018 15:55    MDM Patient presents with a fever 9 days post-op.  Due to the patient's high fever and lack of a flu immunization, a flu swab was administered.  Flu test negative.  CBC demonstrates microcytic anemia, and elevated platelets. Values correlate with postoperative state and are consistent with 09/02/2018.  There is no WBC elevation.    D-dimer positive.    Lactic acid levels elevated.    EKG shows sinus tachycardia at 108 BPM.     Suzanna Obey 09/10/2018, 11:59 AM   Assessment and Plan   I  confirm that I have verified the information documented in the physician assistant student's note and that I have also personally reperformed the history, physical exam and all medical decision making activities of this service and have verified that all service and findings are accurately documented in this student's note.   This patient was evaluated by the PA student, myself, & Dr. Rosana Hoes. Initially had mildly elevated lactic acid at 2.1 but not leukocytosis & repeat lactic acid normal Negative influenza swab  Abd/pelvis CT with ?surgical site infection.  D-Dimer elevated but CTA negative. SpO2 100% & reports breathing improved s/p duoneb treatment.  Temp improved after tylenol & IV fluids.   C/w Dr. Rosana Hoes, who spoke with the original surgeon (Dr. Rip Harbour), and will plan to admit patient for abx to treat surgical site infection.   A: Post op complication Surgical site infection  P: Admit to 3rd floor for IV abx   Jorje Guild, NP 09/10/2018 7:40 PM

## 2018-09-11 DIAGNOSIS — R5082 Postprocedural fever: Secondary | ICD-10-CM

## 2018-09-11 LAB — TSH: TSH: 1.109 u[IU]/mL (ref 0.350–4.500)

## 2018-09-11 LAB — CBC
HCT: 23.7 % — ABNORMAL LOW (ref 36.0–46.0)
Hemoglobin: 7.3 g/dL — ABNORMAL LOW (ref 12.0–15.0)
MCH: 23.2 pg — ABNORMAL LOW (ref 26.0–34.0)
MCHC: 30.8 g/dL (ref 30.0–36.0)
MCV: 75.5 fL — ABNORMAL LOW (ref 80.0–100.0)
Platelets: 424 10*3/uL — ABNORMAL HIGH (ref 150–400)
RBC: 3.14 MIL/uL — ABNORMAL LOW (ref 3.87–5.11)
RDW: 14.8 % (ref 11.5–15.5)
WBC: 6.2 10*3/uL (ref 4.0–10.5)
nRBC: 0 % (ref 0.0–0.2)

## 2018-09-11 LAB — LIPASE, BLOOD: Lipase: 33 U/L (ref 11–51)

## 2018-09-11 LAB — URINE CULTURE: CULTURE: NO GROWTH

## 2018-09-11 LAB — PREPARE RBC (CROSSMATCH)

## 2018-09-11 LAB — GLUCOSE, CAPILLARY
Glucose-Capillary: 242 mg/dL — ABNORMAL HIGH (ref 70–99)
Glucose-Capillary: 195 mg/dL — ABNORMAL HIGH (ref 70–99)
Glucose-Capillary: 226 mg/dL — ABNORMAL HIGH (ref 70–99)

## 2018-09-11 LAB — AMYLASE: Amylase: 32 U/L (ref 28–100)

## 2018-09-11 MED ORDER — METFORMIN HCL ER 750 MG PO TB24
750.0000 mg | ORAL_TABLET | Freq: Every day | ORAL | Status: DC
  Administered 2018-09-12: 750 mg via ORAL
  Filled 2018-09-11 (×2): qty 1

## 2018-09-11 MED ORDER — INSULIN ASPART 100 UNIT/ML ~~LOC~~ SOLN
0.0000 [IU] | Freq: Three times a day (TID) | SUBCUTANEOUS | Status: DC
  Administered 2018-09-11: 3 [IU] via SUBCUTANEOUS
  Administered 2018-09-11 – 2018-09-12 (×2): 5 [IU] via SUBCUTANEOUS
  Administered 2018-09-12: 8 [IU] via SUBCUTANEOUS
  Administered 2018-09-12 – 2018-09-13 (×2): 3 [IU] via SUBCUTANEOUS
  Administered 2018-09-13 (×2): 5 [IU] via SUBCUTANEOUS
  Administered 2018-09-14 – 2018-09-15 (×3): 2 [IU] via SUBCUTANEOUS
  Administered 2018-09-15 – 2018-09-16 (×2): 3 [IU] via SUBCUTANEOUS

## 2018-09-11 MED ORDER — SODIUM CHLORIDE 0.9% IV SOLUTION
Freq: Once | INTRAVENOUS | Status: AC
  Administered 2018-09-11: 10:00:00 via INTRAVENOUS

## 2018-09-11 MED ORDER — DIPHENHYDRAMINE HCL 25 MG PO CAPS
25.0000 mg | ORAL_CAPSULE | Freq: Once | ORAL | Status: AC
  Administered 2018-09-11: 25 mg via ORAL
  Filled 2018-09-11: qty 1

## 2018-09-11 MED ORDER — GENTAMICIN SULFATE 40 MG/ML IJ SOLN
5.0000 mg/kg | INTRAVENOUS | Status: DC
  Administered 2018-09-11 – 2018-09-12 (×2): 290 mg via INTRAVENOUS
  Filled 2018-09-11 (×2): qty 7.25

## 2018-09-11 MED ORDER — CLINDAMYCIN PHOSPHATE 900 MG/50ML IV SOLN
900.0000 mg | Freq: Three times a day (TID) | INTRAVENOUS | Status: DC
  Administered 2018-09-11 – 2018-09-13 (×7): 900 mg via INTRAVENOUS
  Filled 2018-09-11 (×11): qty 50

## 2018-09-11 MED ORDER — BISACODYL 10 MG RE SUPP
10.0000 mg | Freq: Once | RECTAL | Status: AC
  Administered 2018-09-11: 10 mg via RECTAL
  Filled 2018-09-11: qty 1

## 2018-09-11 MED ORDER — INSULIN ASPART 100 UNIT/ML ~~LOC~~ SOLN
10.0000 [IU] | Freq: Once | SUBCUTANEOUS | Status: AC
  Administered 2018-09-11: 10 [IU] via SUBCUTANEOUS

## 2018-09-11 NOTE — Progress Notes (Signed)
Subjective: Patient without complaints this morning. Denies HA, CP, SOB, N,V or pain. Tolerating diet. Up to restroom without problems.  Objective: TM 103 at 0400 VSS   Good UOP  Lungs clear A & P Heart RRR Back no CVA tenderness ABd soft + BS incision CDI min tenderness uterus enlarged unchanged from post op GU no bleeding Ext non tender trace edema   Assessment/Plan: POD # 10 Abd myomectomy Post OP fever ? Etiology Anemia secondary to surgery   Will change antibiotics to Gent/Clinda. Transfuse with PRBCs, Stimulate bowels. Restart Metformin and follow CBG's. Await UC and BC. Repeat labs in AM.    LOS: 1 day    Chancy Milroy 09/11/2018, 9:55 AM

## 2018-09-11 NOTE — Progress Notes (Signed)
BS 242. Dr. Rosana Hoes aware. No new orders given at this time. Toya Smothers RN

## 2018-09-11 NOTE — Progress Notes (Signed)
Dr. Rip Harbour aware of patient's temp of 102.7. First unit of blood is complete. Pt has been medicated with Tylenol/Motrin. No blood product infusing at this time. Instructed to continue with blood transfusion per Dr. Rip Harbour. Per Dr. Rip Harbour, Temperature is due to infection. Toya Smothers, RN

## 2018-09-11 NOTE — Progress Notes (Signed)
Pharmacy Antibiotic Note  Tricia Benitez is a 33 y.o. female admitted on 09/10/2018 with wound infection.  Pharmacy has been consulted for gentamicin dosing.  Plan: Start gentamicin 5 mg/kg (290mg ). Troughs only if continued beyond 72 hours.  Height: 5\' 5"  (165.1 cm) Weight: 165 lb (74.8 kg) IBW/kg (Calculated) : 57  Temp (24hrs), Avg:100.9 F (38.3 C), Min:98.7 F (37.1 C), Max:103.1 F (39.5 C)  Recent Labs  Lab 09/10/18 1227 09/10/18 1524 09/10/18 1708 09/11/18 0526  WBC 6.9  --   --  6.2  CREATININE 0.53  --  0.53  --   LATICACIDVEN 2.1* 1.3  --   --     Estimated Creatinine Clearance: 103.1 mL/min (by C-G formula based on SCr of 0.53 mg/dL).    Allergies  Allergen Reactions  . Penicillins Other (See Comments)    Do not remember symptoms but was told DO NOT TAKE.   Marland Kitchen Glipizide Hives    Antimicrobials this admission: Doxycycline 100mg  Q12H 2/20 >> 2/21  Clindamycin  900mg  Q8H 2/21 >>  Metronidazole 500mg  Q6H 2/21 >> 2/21   Microbiology results: 2/20 BCx: pending 2/20 UCx: pending  2/20 Influenza panel: negative  Thank you for allowing pharmacy to be a part of this patient's care.  Stefanie Libel 09/11/2018 9:36 AM

## 2018-09-11 NOTE — Progress Notes (Signed)
Spoke with Dr. Rosana Hoes in regards to patient having a temp of 102.7 and whether to continue the blood transfusion and if the need to start transfusion reaction protocol. Dr. Rosana Hoes states to wait 2 hours before starting next unit and not to proceed with blood transaction reaction order. Blood bank notified. Will continue to monitor. Toya Smothers, RN

## 2018-09-11 NOTE — Progress Notes (Signed)
Inpatient Diabetes Program Recommendations  AACE/ADA: New Consensus Statement on Inpatient Glycemic Control (2015)  Target Ranges:  Prepandial:   less than 140 mg/dL      Peak postprandial:   less than 180 mg/dL (1-2 hours)      Critically ill patients:  140 - 180 mg/dL   Lab Results  Component Value Date   GLUCAP 183 (H) 09/03/2018   HGBA1C 8.4 (H) 07/24/2018    Review of Glycemic Control Results for Tricia Benitez, Tricia Benitez (MRN [id number]) as of 09/11/2018 08:16  Ref. Range 09/02/2018 21:46 09/03/2018 09:10  Glucose-Capillary Latest Ref Range: 70 - 99 mg/dL 223 (H) 183 (H)   Diabetes history: Type 2 DM Outpatient Diabetes medications: Metformin 750 mg QAM Current orders for Inpatient glycemic control: none  Inpatient Diabetes Program Recommendations:    Noted elevated serum glucoses above inpatient goals. Consider adding Novolog 0-15 units TID & HS, along with CBGs. Additionally, consider switching to carb modified diet.   Thanks, Bronson Curb, MSN, RNC-OB Diabetes Coordinator [phone] (8a-5p)

## 2018-09-11 NOTE — Progress Notes (Signed)
Contact with Dr. Rip Harbour. Ask to call the doctor on call for unit which is Dr. Rosana Hoes. [place]

## 2018-09-11 NOTE — Progress Notes (Signed)
Dr. Rosana Hoes aware of Temp 102.9. Dr. Rosana Hoes @ bedside. Toya Smothers, RN

## 2018-09-11 NOTE — Progress Notes (Signed)
Faculty Note  Up to see [name]. She is doing okay, states she feels the same as she did yesterday, no better/worse. Did have some shaking with the fever earlier. Otherwise, some nausea with no vomiting.   BP 122/73 (BP Location: Left Arm)   Pulse (!) 115   Temp (!) 102.9 F (39.4 C)   Resp 16   Ht 5\' 5"  (1.651 m)   Wt 74.8 kg   LMP 08/31/2018   SpO2 100%   BMI 27.46 kg/m   Gen: alert, oriented  A/P: 33 yo G0 POD#9 s/p abdominal myomectomy, readmitted last pm for fever of unknown origin, suspected surgical site infection. Abx changed to gent/clinda this am, still with no elevated white count. Still spiking temps to 102. Has received 1 unit pRBCs. Fever likely secondary to infection, will continue with blood transfusion. Reviewed all of the above with patient, she is agreeable with plan. Will also start sliding scale insulin for improved BG control as her lunch BG was 248.    Feliz Beam, M.D. Attending Center for [employer] farm manager)

## 2018-09-12 DIAGNOSIS — R5082 Postprocedural fever: Secondary | ICD-10-CM | POA: Diagnosis not present

## 2018-09-12 LAB — BPAM RBC
Blood Product Expiration Date: 202003012359
Blood Product Expiration Date: 202003022359
Blood Product Expiration Date: 202003022359
ISSUE DATE / TIME: 202002211040
ISSUE DATE / TIME: 202002211606
ISSUE DATE / TIME: 202002212021
Unit Type and Rh: 9500
Unit Type and Rh: 9500
Unit Type and Rh: 9500

## 2018-09-12 LAB — GLUCOSE, CAPILLARY
GLUCOSE-CAPILLARY: 210 mg/dL — AB (ref 70–99)
Glucose-Capillary: 176 mg/dL — ABNORMAL HIGH (ref 70–99)
Glucose-Capillary: 268 mg/dL — ABNORMAL HIGH (ref 70–99)
Glucose-Capillary: 183 mg/dL — ABNORMAL HIGH (ref 70–99)

## 2018-09-12 LAB — COMPREHENSIVE METABOLIC PANEL
ALT: 21 U/L (ref 0–44)
ANION GAP: 8 (ref 5–15)
AST: 25 U/L (ref 15–41)
Albumin: 3 g/dL — ABNORMAL LOW (ref 3.5–5.0)
Alkaline Phosphatase: 71 U/L (ref 38–126)
BUN: 6 mg/dL (ref 6–20)
CO2: 22 mmol/L (ref 22–32)
Calcium: 8 mg/dL — ABNORMAL LOW (ref 8.9–10.3)
Chloride: 102 mmol/L (ref 98–111)
Creatinine, Ser: 0.57 mg/dL (ref 0.44–1.00)
GFR calc Af Amer: >60 mL/min (ref >60)
GFR calc non Af Amer: >60 mL/min (ref >60)
Glucose, Bld: 211 mg/dL — ABNORMAL HIGH (ref 70–99)
Potassium: 3.9 mmol/L (ref 3.5–5.1)
Sodium: 132 mmol/L — ABNORMAL LOW (ref 135–145)
Total Bilirubin: 1.2 mg/dL (ref 0.3–1.2)
Total Protein: 6.7 g/dL (ref 6.5–8.1)

## 2018-09-12 LAB — CBC WITH DIFFERENTIAL/PLATELET
Basophils Absolute: 0 10*3/uL (ref 0.0–0.1)
Basophils Relative: 0 %
Eosinophils Absolute: 0 10*3/uL (ref 0.0–0.5)
Eosinophils Relative: 0 %
HCT: 32.5 % — ABNORMAL LOW (ref 36.0–46.0)
Hemoglobin: 10.6 g/dL — ABNORMAL LOW (ref 12.0–15.0)
Lymphocytes Relative: 6 %
Lymphs Abs: 0.5 10*3/uL — ABNORMAL LOW (ref 0.7–4.0)
MCH: 25.5 pg — ABNORMAL LOW (ref 26.0–34.0)
MCHC: 32.6 g/dL (ref 30.0–36.0)
MCV: 78.1 fL — ABNORMAL LOW (ref 80.0–100.0)
Monocytes Absolute: 0.3 10*3/uL (ref 0.1–1.0)
Monocytes Relative: 3 %
Neutro Abs: 8.5 10*3/uL — ABNORMAL HIGH (ref 1.7–7.7)
Neutrophils Relative %: 91 %
Platelets: 378 10*3/uL (ref 150–400)
RBC: 4.16 MIL/uL (ref 3.87–5.11)
RDW: 15.5 % (ref 11.5–15.5)
WBC: 9.3 10*3/uL (ref 4.0–10.5)
nRBC: 0 % (ref 0.0–0.2)

## 2018-09-12 LAB — HIV ANTIBODY (ROUTINE TESTING W REFLEX): HIV Screen 4th Generation wRfx: NONREACTIVE

## 2018-09-12 LAB — TYPE AND SCREEN
ABO/RH(D): A POS
Antibody Screen: NEGATIVE
Unit division: 0
Unit division: 0
Unit division: 0

## 2018-09-12 LAB — GENTAMICIN LEVEL, RANDOM
Gentamicin Rm: 3.4 ug/mL
Gentamicin Rm: 1.1 ug/mL

## 2018-09-12 MED ORDER — GUAIFENESIN 100 MG/5ML PO SOLN
10.0000 mL | ORAL | Status: DC | PRN
  Administered 2018-09-12: 10 mL via ORAL
  Administered 2018-09-13 – 2018-09-14 (×3): 200 mg via ORAL
  Filled 2018-09-12: qty 10
  Filled 2018-09-12: qty 5
  Filled 2018-09-12 (×2): qty 10
  Filled 2018-09-12: qty 5
  Filled 2018-09-12: qty 10

## 2018-09-12 MED ORDER — GENTAMICIN SULFATE 40 MG/ML IJ SOLN
7.0000 mg/kg | INTRAVENOUS | Status: DC
  Administered 2018-09-12: 450 mg via INTRAVENOUS
  Filled 2018-09-12 (×2): qty 11.25

## 2018-09-12 MED ORDER — IBUPROFEN 800 MG PO TABS
800.0000 mg | ORAL_TABLET | Freq: Once | ORAL | Status: AC
  Administered 2018-09-12: 800 mg via ORAL
  Filled 2018-09-12: qty 1

## 2018-09-12 MED ORDER — METFORMIN HCL ER 500 MG PO TB24
500.0000 mg | ORAL_TABLET | Freq: Two times a day (BID) | ORAL | Status: DC
  Administered 2018-09-12 – 2018-09-16 (×8): 500 mg via ORAL
  Filled 2018-09-12 (×8): qty 1

## 2018-09-12 MED ORDER — ENOXAPARIN SODIUM 80 MG/0.8ML ~~LOC~~ SOLN
1.0000 mg/kg | Freq: Two times a day (BID) | SUBCUTANEOUS | Status: DC
  Administered 2018-09-12 – 2018-09-15 (×8): 75 mg via SUBCUTANEOUS
  Filled 2018-09-12 (×10): qty 0.8

## 2018-09-12 NOTE — Progress Notes (Signed)
Pharmacy Antibiotic Note  Pt has been receiving Gentamicin 290mg  (5mg /kg) IV Q 24. Gentamicin levels were ordered today @ 13:56 = 3.4 , 20:10=1.1 to check if in therapeutic range after increased temp spikes. Kinetics: ke= 0.189 t 1/2=3.7  Vd=0.8L/kg  Plan: Gentamicin 450mg  (7mg /kg) IV Q24 to start at 2300 09/12/18  Height: 5\' 5"  (165.1 cm) Weight: 165 lb (74.8 kg) IBW/kg (Calculated) : 57  ABW=64 kg  Temp (24hrs), Avg:101.1 F (38.4 C), Min:98 F (36.7 C), Max:103.4 F (39.7 C)  Recent Labs  Lab 09/10/18 1227 09/10/18 1524 09/10/18 1708 09/11/18 0526 09/12/18 0711 09/12/18 1356 09/12/18 2010  WBC 6.9  --   --  6.2 9.3  --   --   CREATININE 0.53  --  0.53  --  0.57  --   --   LATICACIDVEN 2.1* 1.3  --   --   --   --   --   GENTRANDOM  --   --   --   --   --  3.4 1.1    Estimated Creatinine Clearance: 103.1 mL/min (by C-G formula based on SCr of 0.57 mg/dL).    Allergies  Allergen Reactions  . Penicillins Other (See Comments)    Do not remember symptoms but was told DO NOT TAKE.   Marland Kitchen Glipizide Hives     Thank you for allowing pharmacy to be a part of this patient's care.  Hovey-Rankin, Elber Galyean 09/12/2018 10:45 PM

## 2018-09-12 NOTE — Progress Notes (Signed)
Subjective: Pt sitting up in chair this morning. No complaints except for a dry non productive cough. No pain. Tolerating diet. + Flatus and BM. Up to restroom without problems.   Objective: T max 1029.9 at 1447 yesterday  Tc 102.7 VSS  Lungs clear Heart RRR Abd soft + bs incision CDI Ext non tender   Assessment/Plan: POD # 11 Abd myomectomy Post Op fever ? Etiology Anemia s/p blood transfusion DM  No significant change form yesterday. Has been on new antibiotics approx 24 hrs. BC negative x 24 hrs. ? Septic pelvic thrombophlebitis, if continues with fever spikes consider adding Lovenox..   LOS: 2 days    Tricia Benitez 09/12/2018, 8:07 AM

## 2018-09-13 DIAGNOSIS — R5082 Postprocedural fever: Secondary | ICD-10-CM | POA: Diagnosis not present

## 2018-09-13 LAB — GLUCOSE, CAPILLARY
Glucose-Capillary: 210 mg/dL — ABNORMAL HIGH (ref 70–99)
Glucose-Capillary: 171 mg/dL — ABNORMAL HIGH (ref 70–99)
Glucose-Capillary: 213 mg/dL — ABNORMAL HIGH (ref 70–99)
Glucose-Capillary: 153 mg/dL — ABNORMAL HIGH (ref 70–99)
Glucose-Capillary: 164 mg/dL — ABNORMAL HIGH (ref 70–99)
Glucose-Capillary: 148 mg/dL — ABNORMAL HIGH (ref 70–99)

## 2018-09-13 NOTE — Progress Notes (Signed)
Pt does not want anymore gentamycin. Pt states gentamycin is causing her fever. Explained to pt gentamycin does not cause fever, but prevent or treats infection. Pt still refused. Will let MD know.

## 2018-09-13 NOTE — Progress Notes (Signed)
Subjective: Pt feels about the same as yesterday. No cough presently. Tolerating diet. + Flatus and voiding without problems   Objective: Tm 103 @ 1400 Tc AF  Lungs clear Heart RRR Abd soft + BS non tender incision CDI Ext non tender   Assessment/Plan: POD # 12 Abd myomectomy Post OP fever probable SPT Anemia s/p transfusion DM  Started on Lovenox bid yesterday. Continue with Gent/Clinda. Metformin for DM. Pt to been transferred to [facility] this AM for [facility].  Transfer discussed with pt. Pt verbalized understanding   LOS: 3 days    Chancy Milroy 09/13/2018, 4:24 AM

## 2018-09-13 NOTE — Progress Notes (Signed)
Report called and given to 6N RN.  

## 2018-09-14 DIAGNOSIS — R5082 Postprocedural fever: Secondary | ICD-10-CM | POA: Diagnosis not present

## 2018-09-14 LAB — GLUCOSE, CAPILLARY
GLUCOSE-CAPILLARY: 150 mg/dL — AB (ref 70–99)
Glucose-Capillary: 143 mg/dL — ABNORMAL HIGH (ref 70–99)
Glucose-Capillary: 105 mg/dL — ABNORMAL HIGH (ref 70–99)
Glucose-Capillary: 122 mg/dL — ABNORMAL HIGH (ref 70–99)

## 2018-09-14 NOTE — Progress Notes (Signed)
Subjective: Patient reports feeling better today. Ambulating and voiding without problems. Tolerating diet.  Has been refusing antibiotics as she thinks this is causing her fever    Objective: Tm 102 @ midnight VSS  Lungs clear Heart RRR Abd soft + BS incision CDI Ext non tedner   Assessment/Plan: POD # 13 Abd myomectomy SPT Anemia s/p transfusion DM  Feels better today. Fever curve is decreasing. BC remain negative. Pt refusing antibiotics as well. Dx explained to pt. Will d/c antibiotics and follow temp curve. Plan to continue Lovenox until AF x 48 hrs.    LOS: 4 days    Chancy Milroy 09/14/2018, 11:18 AM

## 2018-09-14 NOTE — Progress Notes (Signed)
Pt refused IV gentamicin and clindamycin abt, she said its making her sick. Latest temp is 98.8. report to MD and incoming nurse made aware.

## 2018-09-15 DIAGNOSIS — R5082 Postprocedural fever: Secondary | ICD-10-CM | POA: Diagnosis not present

## 2018-09-15 LAB — CBC WITH DIFFERENTIAL/PLATELET
Abs Immature Granulocytes: 0.06 10*3/uL (ref 0.00–0.07)
Basophils Absolute: 0 10*3/uL (ref 0.0–0.1)
Basophils Relative: 0 %
Eosinophils Absolute: 0.1 10*3/uL (ref 0.0–0.5)
Eosinophils Relative: 1 %
HCT: 31.3 % — ABNORMAL LOW (ref 36.0–46.0)
Hemoglobin: 9.9 g/dL — ABNORMAL LOW (ref 12.0–15.0)
Immature Granulocytes: 1 %
Lymphocytes Relative: 26 %
Lymphs Abs: 2.1 10*3/uL (ref 0.7–4.0)
MCH: 24.6 pg — ABNORMAL LOW (ref 26.0–34.0)
MCHC: 31.6 g/dL (ref 30.0–36.0)
MCV: 77.9 fL — ABNORMAL LOW (ref 80.0–100.0)
Monocytes Absolute: 0.7 10*3/uL (ref 0.1–1.0)
Monocytes Relative: 8 %
Neutro Abs: 5.3 10*3/uL (ref 1.7–7.7)
Neutrophils Relative %: 64 %
Platelets: 413 10*3/uL — ABNORMAL HIGH (ref 150–400)
RBC: 4.02 MIL/uL (ref 3.87–5.11)
RDW: 16.7 % — ABNORMAL HIGH (ref 11.5–15.5)
WBC: 8.2 10*3/uL (ref 4.0–10.5)
nRBC: 0 % (ref 0.0–0.2)

## 2018-09-15 LAB — CULTURE, BLOOD (ROUTINE X 2)
Culture: NO GROWTH
Special Requests: ADEQUATE

## 2018-09-15 LAB — GLUCOSE, CAPILLARY
Glucose-Capillary: 172 mg/dL — ABNORMAL HIGH (ref 70–99)
Glucose-Capillary: 127 mg/dL — ABNORMAL HIGH (ref 70–99)
Glucose-Capillary: 120 mg/dL — ABNORMAL HIGH (ref 70–99)
Glucose-Capillary: 133 mg/dL — ABNORMAL HIGH (ref 70–99)

## 2018-09-15 MED ORDER — LORATADINE 10 MG PO TABS
10.0000 mg | ORAL_TABLET | Freq: Every day | ORAL | Status: DC
  Administered 2018-09-15: 10 mg via ORAL
  Filled 2018-09-15: qty 1

## 2018-09-15 NOTE — Progress Notes (Addendum)
Paged Dr. Elly Modena for patient requesting claritin.   1800 Attempted to call Dr. Elly Modena.

## 2018-09-15 NOTE — Progress Notes (Signed)
Subjective: Pt without complaints this morning. Ambulating, voiding, + flatus, tolerating diet and denies pain.  Objective: AF since midnight Sunday VSS  Lungs clear Heart RRR Abd soft + BS incision CDI Ext non tender   Assessment/Plan: POD # 14 Abd myomectomy SPT Anemia s/p transfusion DM  AF now x 24 hours, off antibiotics. Will continue with Lovenox until AF x 48 hours and then discharge home.  Continue Metformin and SSI for DM   LOS: 5 days    Chancy Milroy 09/15/2018, 9:20 AM

## 2018-09-16 DIAGNOSIS — Z3482 Encounter for supervision of other normal pregnancy, second trimester: Secondary | ICD-10-CM

## 2018-09-16 DIAGNOSIS — R5082 Postprocedural fever: Secondary | ICD-10-CM | POA: Diagnosis not present

## 2018-09-16 LAB — GLUCOSE, CAPILLARY: Glucose-Capillary: 184 mg/dL — ABNORMAL HIGH (ref 70–99)

## 2018-09-16 NOTE — Discharge Instructions (Signed)
Myomectomy, Care After °This sheet gives you information about how to care for yourself after your procedure. Your health care provider may also give you more specific instructions. If you have problems or questions, contact your health care provider. °What can I expect after the procedure? °After the procedure, it is common to have: °· Pain in your abdomen, especially at the incision areas. You will be given pain medicine to control the pain. °· Tiredness. This is a normal part of the recovery process. Your energy level will return to normal over the coming weeks. °· Vaginal bleeding. This is normal and will stop in the coming weeks. °· Constipation. °Recovery time from this procedure will depend on the type of procedure you had and your general overall health prior to the procedure. °Follow these instructions at home: °Medicines °· Take over-the-counter and prescription medicines only as told by your health care provider. °· Do not take aspirin because it can cause bleeding. °· If you were prescribed an antibiotic medicine, use it as told by your health care provider. Do not stop using the antibiotic even if you start to feel better. °· Do not drive or use heavy machinery while taking prescription pain medicine. °· Do not drink alcohol while taking prescription pain medicine. °Incision care ° °· Follow instructions from your health care provider about how to take care of any incisions. Make sure you: °? Wash your hands with soap and water before you change your bandage (dressing). If soap and water are not available, use hand sanitizer. °? Change your dressing as told by your health care provider. °? Leave stitches (sutures), skin glue, or adhesive strips in place. These skin closures may need to stay in place for 2 weeks or longer. If adhesive strip edges start to loosen and curl up, you may trim the loose edges. Do not remove adhesive strips completely unless your health care provider tells you to do  that. °· Check your incision areas every day for signs of infection. Check for: °? Redness, swelling, or pain. °? Fluid or blood. °? Warmth. °? Pus or a bad smell. °· Do not take baths, swim, or use a hot tub until your health care provider approves. Take showers as directed by your health care provider. °Activity °· Return to your normal activities as told by your health care provider. Ask your health care provider what activities are safe for you. °· Do not do activities that require a lot of effort until your health care provider says it is okay. °· Do not lift anything that is heavier than 15 lb (6.8 kg) until your health care provider says that it is safe. °· Do not douche, use tampons, or have sexual intercourse until your health care provider approves. °· Walk daily but take frequent rest breaks if you tire easily. °· Continue to practice deep breathing and coughing. If it hurts to cough, try holding a pillow against your belly as you cough. °· Do not drive until your health care provider approves. °General instructions °· To prevent or treat constipation while you are taking prescription pain medicine, your health care provider may recommend that you: °? Drink enough fluid to keep your urine clear or pale yellow. °? Take over-the-counter or prescription medicines. °? Eat foods that are high in fiber, such as fresh fruits and vegetables, whole grains, and beans. °? Limit foods that are high in fat and processed sugars, such as fried and sweet foods. °· Take your temperature twice a day   and write it down. If you develop a fever, this may be a sign that you have an infection. °· Do not drink alcohol. °· Have someone help you at home for 1 week or until you can do your own household activities. °· Keep all follow-up visits as told by your health care provider. This is important. °Contact a health care provider if: °· You have a fever. °· You have increasing abdominal pain that is not relieved with  medicine. °· You have nausea, vomiting, or diarrhea. °· You have pain when you urinate or you have blood in your urine. °· You have a rash on your body. °· You have pain or redness where your IV access tube was inserted. °· You have redness, swelling, or pain around an incision. °· You have fluid or blood coming from an incision. °· An incision feels warm to the touch. °· You have pus or a bad smell coming from an incision. °Get help right away if: °· You have weakness or light-headedness. °· You have pain, swelling, or redness in your legs. °· You have chest pain. °· You faint. °· You have shortness of breath. °· You have heavy vaginal bleeding. °· You have an incision that is opening up. °Summary °· Recovery time from this procedure will depend on the type of procedure you had and your general overall health prior to the procedure. °· If you were prescribed an antibiotic medicine, use it as told by your health care provider. Do not stop using the antibiotic even if you start to feel better. °· Do not douche, use tampons, or have sexual intercourse until your health care provider approves. °· Return to your normal activities as told by your health care provider. Ask your health care provider what activities are safe for you. °This information is not intended to replace advice given to you by your health care provider. Make sure you discuss any questions you have with your health care provider. °Document Released: 11/28/2010 Document Revised: 08/08/2016 Document Reviewed: 08/08/2016 °Elsevier Interactive Patient Education © 2019 [employer] ° °

## 2018-09-16 NOTE — Discharge Summary (Signed)
Physician Discharge Summary  Patient ID: Tricia Benitez MRN: [id number] DOB/AGE: 32/25/1987 32 y.o.  Admit date: 09/10/2018 Discharge date: 09/16/2018  Admission Diagnoses: Fever  Discharge Diagnoses:  Active Problems:   Postoperative fever Septic Pelvic thrombophlebitis  Discharged Condition: good  Hospital Course: Tricia Benitez present with post op fever on 09/09/18. She had an abd myomectomy on 08/25/18. Post op course was unremarkable and was discharged home on POD # 2. Pt reported doing well until DOA. Was seen in MAU with extensive work for source/cause of fever. W/U was negative. Pt was admitted with post op fever and started on IV antibiotics. She continued to have fevers. SPT was suspected and she was started on Lovenox. Fever curved decreased and pt has now been afebrile for over 48 hrs. She was ambulating, voiding, tolerating diet and good oral pain control. Felt amendable for discharge home with routine post op care and follow up. Discharge instructions, medications and follow up reviewed with pt. Pt verbalized understanding.  Consults: None  Significant Diagnostic Studies: labs, microbiology, CT scan  Treatments: IV hydration, antibiotics, Lovenox  Discharge Exam: Blood pressure 97/68, pulse 81, temperature 98.9 F (37.2 C), temperature source Oral, resp. rate 18, height 5\' 5"  (1.651 m), weight 74.8 kg, last menstrual period 08/31/2018, SpO2 100 %.  Lungs clear Heart RRR Abd soft + BS incision CDI Ext non tedner  Disposition: Discharge disposition: 01-Home or Self Care       Discharge Instructions    Call MD for:  difficulty breathing, headache or visual disturbances   Complete by:  As directed    Call MD for:  extreme fatigue   Complete by:  As directed    Call MD for:  persistant dizziness or light-headedness   Complete by:  As directed    Call MD for:  persistant nausea and vomiting   Complete by:  As directed    Call MD for:  redness, tenderness, or  signs of infection (pain, swelling, redness, odor or green/yellow discharge around incision site)   Complete by:  As directed    Call MD for:  severe uncontrolled pain   Complete by:  As directed    Call MD for:  temperature >100.4   Complete by:  As directed    Diet Carb Modified   Complete by:  As directed    Increase activity slowly   Complete by:  As directed    Lifting restrictions   Complete by:  As directed    No lifting over 10 #   Sexual Activity Restrictions   Complete by:  As directed    Pelvic rest until follow up appt     Allergies as of 09/16/2018      Reactions   Penicillins Other (See Comments)   Do not remember symptoms but was told DO NOT TAKE.    Glipizide Hives      Medication List    TAKE these medications   albuterol 108 (90 Base) MCG/ACT inhaler Commonly known as:  PROVENTIL HFA;VENTOLIN HFA Inhale 2 puffs into the lungs every 6 (six) hours as needed for wheezing or shortness of breath.   metFORMIN 500 MG tablet Commonly known as:  GLUCOPHAGE Take by mouth 2 (two) times daily with a meal. What changed:  Another medication with the same name was removed. Continue taking this medication, and follow the directions you see here.      Follow-up Information    [facility] Follow up.   Why:  Pt already  has follow up appt with Dr. Gardiner Fanti Contact information: [address] [place] [address] [place] [address] [phone]          Signed: Chancy Milroy 09/16/2018, 12:41 PM

## 2018-09-29 ENCOUNTER — Encounter: Payer: Self-pay | Admitting: Nurse Practitioner

## 2018-10-02 ENCOUNTER — Other Ambulatory Visit: Payer: Self-pay

## 2018-10-02 ENCOUNTER — Ambulatory Visit (INDEPENDENT_AMBULATORY_CARE_PROVIDER_SITE_OTHER): Payer: PRIVATE HEALTH INSURANCE | Admitting: Obstetrics and Gynecology

## 2018-10-02 ENCOUNTER — Encounter: Payer: Self-pay | Admitting: Obstetrics and Gynecology

## 2018-10-02 VITALS — BP 130/89 | HR 80 | Wt 161.3 lb

## 2018-10-02 DIAGNOSIS — Z9889 Other specified postprocedural states: Secondary | ICD-10-CM

## 2018-10-02 NOTE — Progress Notes (Signed)
Pt is in the office for follow up after abdominal myomectomy on 09-01-18. Pt denies any abnormal symptoms.

## 2018-10-02 NOTE — Progress Notes (Signed)
Patient ID: Tricia Benitez, female   DOB: 1985/12/29, 33 y.o.   MRN: [id number] Tricia Benitez here post op and post hospitalization visit. Pt s/p abd myomectomy and was hospitalization for SPVT.  She reports no problems today. Energy returning. No bowel or bladder dysfunction. Not taking pain medication Tolerating diet No Fever or chills.  PE AF VSS Lungs clear Heart RRR Abd soft + BS incision well healed area of keloid formation note Uterus @ 16 weeks non tender GU deferred  A/P Post OP abd myomectomy/ Hospitalization Doing well. Continue to increase ADL's as tolerates. Return to work 6 weeks from 09/01/18 F/U PRN or in 1 yr.

## 2018-10-02 NOTE — Patient Instructions (Signed)

## 2018-10-23 ENCOUNTER — Ambulatory Visit: Payer: PRIVATE HEALTH INSURANCE | Admitting: Nurse Practitioner

## 2018-10-26 ENCOUNTER — Ambulatory Visit: Payer: PRIVATE HEALTH INSURANCE | Admitting: Nurse Practitioner

## 2018-11-02 ENCOUNTER — Telehealth: Payer: No Typology Code available for payment source | Admitting: Family

## 2018-11-02 DIAGNOSIS — R509 Fever, unspecified: Secondary | ICD-10-CM

## 2018-11-02 DIAGNOSIS — R0602 Shortness of breath: Secondary | ICD-10-CM

## 2018-11-02 NOTE — Progress Notes (Signed)
Based on what you shared with me, I feel your condition warrants further evaluation and I recommend that you be seen for a face to face office visit.  Given your recent surgery and fever and shortness of breath, you need to be seen face to face to be evaluated today.    NOTE: If you entered your credit card information for this eVisit, you will not be charged. You may see a "hold" on your card for the $35 but that hold will drop off and you will not have a charge processed.  If you are having a true medical emergency please call 911.  If you need an urgent face to face visit, [facility] has four urgent care centers for your convenience.    PLEASE NOTE: THE [facility] LOCATIONS AND URGENT CARE CLINICS DO NOT HAVE THE TESTING FOR CORONAVIRUS COVID19 AVAILABLE.  IF YOU FEEL YOU NEED THIS TEST YOU MUST GO TO A TRIAGE LOCATION AT [facility]   [email or web address] to reserve your spot online an avoid wait times  [facility] [address] [place], [place] [address] Modified hours of operation: Monday-Friday, 10 AM to 6 PM  Saturday & Sunday 10 AM to 4 PM *Across the street from [place] (New Address!) [address] [place], [place] [address] *Just off [address], across the road from [place] hours of operation: Monday-Friday, 10 AM to 5 PM  Closed Saturday & Sunday   The following sites will take your insurance:  . [facility] [place] a Provider at this Location  [address] [place], [place] [address] . 10 am to 8 pm Monday-Friday . 12 pm to 8 pm Saturday-Sunday   . [facility] at [place] a Provider at this Location  [place] [place], [place], [place] [address] . 8 am to 8 pm Monday-Friday . 9 am to 6 pm Saturday . 11 am  to 6 pm Sunday   . [facility] at [place] Get Driving Directions  [address] [place],  [address] . 8 am to 8 pm Monday-Friday . 8 am to 4 pm Saturday-Sunday   Your e-visit answers were reviewed by a board certified advanced clinical practitioner to complete your personal care plan.  Thank you for using e-Visits.

## 2018-11-04 ENCOUNTER — Other Ambulatory Visit: Payer: Self-pay

## 2018-11-04 ENCOUNTER — Encounter (HOSPITAL_COMMUNITY): Payer: Self-pay

## 2018-11-04 ENCOUNTER — Inpatient Hospital Stay (HOSPITAL_COMMUNITY)
Admission: EM | Admit: 2018-11-04 | Discharge: 2018-11-08 | DRG: 862 | Disposition: A | Discharge status: Home / Self Care | Payer: No Typology Code available for payment source | Attending: Obstetrics and Gynecology | Admitting: Obstetrics and Gynecology

## 2018-11-04 ENCOUNTER — Emergency Department (HOSPITAL_COMMUNITY): Payer: No Typology Code available for payment source

## 2018-11-04 ENCOUNTER — Ambulatory Visit (HOSPITAL_COMMUNITY)
Admission: EM | Admit: 2018-11-04 | Discharge: 2018-11-04 | Disposition: A | Discharge status: Home / Self Care | Payer: No Typology Code available for payment source | Source: Home / Self Care

## 2018-11-04 DIAGNOSIS — Z825 Family history of asthma and other chronic lower respiratory diseases: Secondary | ICD-10-CM | POA: Diagnosis not present

## 2018-11-04 DIAGNOSIS — K651 Peritoneal abscess: Secondary | ICD-10-CM | POA: Diagnosis present

## 2018-11-04 DIAGNOSIS — J45909 Unspecified asthma, uncomplicated: Secondary | ICD-10-CM | POA: Diagnosis present

## 2018-11-04 DIAGNOSIS — K5900 Constipation, unspecified: Secondary | ICD-10-CM | POA: Diagnosis present

## 2018-11-04 DIAGNOSIS — Z841 Family history of disorders of kidney and ureter: Secondary | ICD-10-CM | POA: Diagnosis not present

## 2018-11-04 DIAGNOSIS — Z79899 Other long term (current) drug therapy: Secondary | ICD-10-CM | POA: Diagnosis not present

## 2018-11-04 DIAGNOSIS — D649 Anemia, unspecified: Secondary | ICD-10-CM | POA: Diagnosis present

## 2018-11-04 DIAGNOSIS — K659 Peritonitis, unspecified: Secondary | ICD-10-CM

## 2018-11-04 DIAGNOSIS — R509 Fever, unspecified: Secondary | ICD-10-CM

## 2018-11-04 DIAGNOSIS — D6832 Hemorrhagic disorder due to extrinsic circulating anticoagulants: Secondary | ICD-10-CM | POA: Diagnosis present

## 2018-11-04 DIAGNOSIS — T8140XA Infection following a procedure, unspecified, initial encounter: Principal | ICD-10-CM | POA: Diagnosis present

## 2018-11-04 DIAGNOSIS — Z880 Allergy status to penicillin: Secondary | ICD-10-CM | POA: Diagnosis not present

## 2018-11-04 DIAGNOSIS — Z8249 Family history of ischemic heart disease and other diseases of the circulatory system: Secondary | ICD-10-CM

## 2018-11-04 DIAGNOSIS — A419 Sepsis, unspecified organism: Secondary | ICD-10-CM | POA: Diagnosis present

## 2018-11-04 DIAGNOSIS — R188 Other ascites: Secondary | ICD-10-CM | POA: Diagnosis present

## 2018-11-04 DIAGNOSIS — Z7984 Long term (current) use of oral hypoglycemic drugs: Secondary | ICD-10-CM

## 2018-11-04 DIAGNOSIS — E876 Hypokalemia: Secondary | ICD-10-CM | POA: Diagnosis present

## 2018-11-04 DIAGNOSIS — Z8349 Family history of other endocrine, nutritional and metabolic diseases: Secondary | ICD-10-CM | POA: Diagnosis not present

## 2018-11-04 DIAGNOSIS — E119 Type 2 diabetes mellitus without complications: Secondary | ICD-10-CM | POA: Diagnosis present

## 2018-11-04 DIAGNOSIS — Z833 Family history of diabetes mellitus: Secondary | ICD-10-CM | POA: Diagnosis not present

## 2018-11-04 DIAGNOSIS — Z811 Family history of alcohol abuse and dependence: Secondary | ICD-10-CM

## 2018-11-04 LAB — URINALYSIS, ROUTINE W REFLEX MICROSCOPIC
Bilirubin Urine: NEGATIVE
Glucose, UA: 50 mg/dL — AB
Ketones, ur: 80 mg/dL — AB
Leukocytes,Ua: NEGATIVE
Nitrite: NEGATIVE
Protein, ur: 100 mg/dL — AB
RBC / HPF: >50 RBC/hpf — ABNORMAL HIGH (ref 0–5)
Specific Gravity, Urine: 1.02 (ref 1.005–1.030)
pH: 6 (ref 5.0–8.0)

## 2018-11-04 LAB — CBC WITH DIFFERENTIAL/PLATELET
Abs Immature Granulocytes: 0.05 10*3/uL (ref 0.00–0.07)
Basophils Absolute: 0 10*3/uL (ref 0.0–0.1)
Basophils Relative: 0 %
Eosinophils Absolute: 0 10*3/uL (ref 0.0–0.5)
Eosinophils Relative: 0 %
HCT: 36 % (ref 36.0–46.0)
Hemoglobin: 10.7 g/dL — ABNORMAL LOW (ref 12.0–15.0)
Immature Granulocytes: 0 %
Lymphocytes Relative: 10 %
Lymphs Abs: 1.2 10*3/uL (ref 0.7–4.0)
MCH: 22.5 pg — ABNORMAL LOW (ref 26.0–34.0)
MCHC: 29.7 g/dL — ABNORMAL LOW (ref 30.0–36.0)
MCV: 75.6 fL — ABNORMAL LOW (ref 80.0–100.0)
Monocytes Absolute: 0.6 10*3/uL (ref 0.1–1.0)
Monocytes Relative: 5 %
Neutro Abs: 10.7 10*3/uL — ABNORMAL HIGH (ref 1.7–7.7)
Neutrophils Relative %: 85 %
Platelets: 426 10*3/uL — ABNORMAL HIGH (ref 150–400)
RBC: 4.76 MIL/uL (ref 3.87–5.11)
RDW: 15.8 % — ABNORMAL HIGH (ref 11.5–15.5)
WBC: 12.5 10*3/uL — ABNORMAL HIGH (ref 4.0–10.5)
nRBC: 0 % (ref 0.0–0.2)

## 2018-11-04 LAB — COMPREHENSIVE METABOLIC PANEL
ALT: 11 U/L (ref 0–44)
AST: 12 U/L — ABNORMAL LOW (ref 15–41)
Albumin: 3 g/dL — ABNORMAL LOW (ref 3.5–5.0)
Alkaline Phosphatase: 96 U/L (ref 38–126)
Anion gap: 15 (ref 5–15)
BUN: 5 mg/dL — ABNORMAL LOW (ref 6–20)
CO2: 20 mmol/L — ABNORMAL LOW (ref 22–32)
Calcium: 8.7 mg/dL — ABNORMAL LOW (ref 8.9–10.3)
Chloride: 100 mmol/L (ref 98–111)
Creatinine, Ser: 0.68 mg/dL (ref 0.44–1.00)
GFR calc Af Amer: >60 mL/min (ref >60)
GFR calc non Af Amer: >60 mL/min (ref >60)
Glucose, Bld: 203 mg/dL — ABNORMAL HIGH (ref 70–99)
Potassium: 2.8 mmol/L — ABNORMAL LOW (ref 3.5–5.1)
Sodium: 135 mmol/L (ref 135–145)
Total Bilirubin: 0.9 mg/dL (ref 0.3–1.2)
Total Protein: 7.7 g/dL (ref 6.5–8.1)

## 2018-11-04 LAB — PREGNANCY, URINE: Preg Test, Ur: NEGATIVE

## 2018-11-04 LAB — LACTIC ACID, PLASMA: Lactic Acid, Venous: 1.5 mmol/L (ref 0.5–1.9)

## 2018-11-04 MED ORDER — ACETAMINOPHEN 325 MG PO TABS
650.0000 mg | ORAL_TABLET | Freq: Once | ORAL | Status: AC
  Administered 2018-11-04: 650 mg via ORAL
  Filled 2018-11-04: qty 2

## 2018-11-04 MED ORDER — METFORMIN HCL 500 MG PO TABS
500.0000 mg | ORAL_TABLET | Freq: Two times a day (BID) | ORAL | Status: DC
  Administered 2018-11-04 – 2018-11-05 (×2): 500 mg via ORAL
  Filled 2018-11-04 (×2): qty 1

## 2018-11-04 MED ORDER — MORPHINE SULFATE (PF) 2 MG/ML IV SOLN: 1.0000 mg | INTRAVENOUS | Status: DC | PRN

## 2018-11-04 MED ORDER — SODIUM CHLORIDE 0.9 % IV SOLN
1000.0000 mL | INTRAVENOUS | Status: DC
  Administered 2018-11-04 – 2018-11-08 (×4): 1000 mL via INTRAVENOUS

## 2018-11-04 MED ORDER — SODIUM CHLORIDE 0.9 % IV BOLUS
1000.0000 mL | Freq: Once | INTRAVENOUS | Status: AC
  Administered 2018-11-04: 1000 mL via INTRAVENOUS

## 2018-11-04 MED ORDER — LEVOFLOXACIN IN D5W 750 MG/150ML IV SOLN
750.0000 mg | Freq: Once | INTRAVENOUS | Status: AC
  Administered 2018-11-04: 13:00:00 750 mg via INTRAVENOUS
  Filled 2018-11-04: qty 150

## 2018-11-04 MED ORDER — ONDANSETRON HCL 4 MG/2ML IJ SOLN: 4.0000 mg | Freq: Four times a day (QID) | INTRAMUSCULAR | Status: DC | PRN

## 2018-11-04 MED ORDER — ZOLPIDEM TARTRATE 5 MG PO TABS: 5.0000 mg | ORAL_TABLET | Freq: Every evening | ORAL | Status: DC | PRN

## 2018-11-04 MED ORDER — HYDROCODONE-ACETAMINOPHEN 5-325 MG PO TABS: 1.0000 | ORAL_TABLET | ORAL | Status: DC | PRN

## 2018-11-04 MED ORDER — METRONIDAZOLE IN NACL 5-0.79 MG/ML-% IV SOLN
500.0000 mg | Freq: Once | INTRAVENOUS | Status: AC
  Administered 2018-11-04: 16:00:00 500 mg via INTRAVENOUS
  Filled 2018-11-04: qty 100

## 2018-11-04 MED ORDER — LEVOFLOXACIN IN D5W 750 MG/150ML IV SOLN: 750.0000 mg | INTRAVENOUS | Status: DC

## 2018-11-04 MED ORDER — ONDANSETRON HCL 4 MG PO TABS: 4.0000 mg | ORAL_TABLET | Freq: Four times a day (QID) | ORAL | Status: DC | PRN

## 2018-11-04 MED ORDER — POTASSIUM CHLORIDE 10 MEQ/100ML IV SOLN
10.0000 meq | INTRAVENOUS | Status: DC
  Administered 2018-11-04 (×2): 10 meq via INTRAVENOUS
  Filled 2018-11-04 (×2): qty 100

## 2018-11-04 MED ORDER — ENOXAPARIN SODIUM 40 MG/0.4ML ~~LOC~~ SOLN
40.0000 mg | SUBCUTANEOUS | Status: DC
  Administered 2018-11-04: 40 mg via SUBCUTANEOUS
  Filled 2018-11-04 (×2): qty 0.4

## 2018-11-04 MED ORDER — ALUM & MAG HYDROXIDE-SIMETH 200-200-20 MG/5ML PO SUSP: 30.0000 mL | ORAL | Status: DC | PRN

## 2018-11-04 MED ORDER — SENNOSIDES-DOCUSATE SODIUM 8.6-50 MG PO TABS: 1.0000 | ORAL_TABLET | Freq: Every evening | ORAL | Status: DC | PRN

## 2018-11-04 MED ORDER — METRONIDAZOLE IN NACL 5-0.79 MG/ML-% IV SOLN
500.0000 mg | Freq: Four times a day (QID) | INTRAVENOUS | Status: DC
  Administered 2018-11-05 – 2018-11-08 (×14): 500 mg via INTRAVENOUS
  Filled 2018-11-04 (×14): qty 100

## 2018-11-04 MED ORDER — IOHEXOL 300 MG/ML  SOLN
100.0000 mL | Freq: Once | INTRAMUSCULAR | Status: AC | PRN
  Administered 2018-11-04: 100 mL via INTRAVENOUS

## 2018-11-04 MED ORDER — LACTATED RINGERS IV SOLN
INTRAVENOUS | Status: DC
  Administered 2018-11-04: 19:00:00 via INTRAVENOUS

## 2018-11-04 NOTE — ED Notes (Signed)
Per provider pt being sent to ED for further evaluation

## 2018-11-04 NOTE — ED Triage Notes (Signed)
Pt has had fevers and stomach pain since, and intermittent SOB  Sunday when period started, surgery 11mo ago for fibroid tumors, evaluated for similar symptoms shortly after surgery

## 2018-11-04 NOTE — ED Provider Notes (Addendum)
[facility] EMERGENCY DEPARTMENT Provider Note   CSN: [id number] Arrival date & time: 11/04/18  1128    History   Chief Complaint Abdominal pain and fever  HPI Tricia Benitez is a 33 y.o. female with history of asthma, type 2 diabetes, anemia presents to the emergency department today with chief complaint of fever and abdominal pain x4 days.  Patient had recent myomectomy on 08/25/2018 to remove 3 large uterine fibroids. PT had a fever x 18 days after surgery and was admitted to MAU for possible septic pelvic thrombophlebitis. She was treated with IV antibiotics and discharged home.   Pt's abdominal pain is new. She reports it started the same day as her menses x4 days ago. She describes the pain as intermittent cramping.  Pain originated near umbilicus but now radiates throughout the abdomen.  She describes the pain as cramping rating it 9 out of 10 in severity.  Patient has a history of menstrual cramps but states her current pain is more severe.  She is also reporting associated intermittent fevers with T-max of 104 yesterday.  She has taken Tylenol with transient fever relief.  Patient states when she is febrile she also feels short of breath.  Reports when her fever resolves shortness of breath also resolves.   Pt admits to decreased appetite.  Patient has not taken her metformin x2 days and reports recent blood sugars around 250.  Patient also states history of constipation.  She used an enema recently and reports last bowel movement 2 days ago. Denies cough, palpitations, chest pain, pelvic pain, back pain, urinary symptoms, melena, diarrhea.  Also denies any sick contacts.  History provided by pt and reviewing medical records.  Past Medical History:  Diagnosis Date   Anemia    Asthma    Diabetes mellitus without complication ([place])    Type II   UTI (urinary tract infection)     Patient Active Problem List   Diagnosis Date Noted   Intra-abdominal fluid  collection 11/04/2018   Status post myomectomy 09/01/2018   Post-operative state 09/01/2018   Vitamin D deficiency 05/04/2018   Left breast mass 04/23/2018    Past Surgical History:  Procedure Laterality Date   BREAST BIOPSY Left 2015   cyst removed.    MOUTH SURGERY     teeth ext    MYOMECTOMY N/A 09/01/2018   Procedure: ABDOMINAL MYOMECTOMY;  Surgeon: Chancy Milroy, MD;  Location: [facility];  Service: Gynecology;  Laterality: N/A;   TONSILLECTOMY       OB History    Gravida  1   Para      Term      Preterm      AB  1   Living        SAB      TAB  1   Ectopic      Multiple      Live Births               Home Medications    Prior to Admission medications   Medication Sig Start Date End Date Taking? Authorizing Provider  acetaminophen (TYLENOL) 500 MG tablet Take 1,000 mg by mouth every 6 (six) hours as needed for mild pain or fever.   Yes [provider]  Cetirizine HCl 10 MG TBDP Take 10 mg by mouth daily.   Yes [provider]  metFORMIN (GLUCOPHAGE) 500 MG tablet Take by mouth 2 (two) times daily with a meal.   Yes  [provider]  Multiple Vitamins-Minerals (MULTIVITAMIN WITH MINERALS) tablet Take 1 tablet by mouth daily.   Yes [provider]    Family History Family History  Problem Relation Age of Onset   Fibroids Mother    Asthma Mother    Diabetes Father    Kidney disease Father    Alcohol abuse Father    Diabetes Paternal Grandmother    Hyperlipidemia Paternal Grandmother    Hypertension Paternal Grandmother    Alcohol abuse Maternal Grandmother     Social History Social History   Tobacco Use   Smoking status: Never Smoker   Smokeless tobacco: Never Used  Substance Use Topics   Alcohol use: Yes    Comment: social   Drug use: Never     Allergies   Penicillins and Glipizide   Review of Systems Review of Systems  Constitutional: Positive for appetite change and  fever. Negative for chills.  HENT: Negative for congestion, ear discharge, ear pain, sinus pressure, sinus pain and sore throat.   Eyes: Negative for pain and redness.  Respiratory: Negative for cough and shortness of breath.   Cardiovascular: Negative for chest pain.  Gastrointestinal: Positive for abdominal pain. Negative for constipation, diarrhea, nausea and vomiting.  Genitourinary: Negative for dysuria and hematuria.  Musculoskeletal: Negative for back pain and neck pain.  Skin: Negative for wound.  Neurological: Negative for weakness, numbness and headaches.     Physical Exam Updated Vital Signs BP (!) 131/55 (BP Location: Right Arm)    Pulse (!) 128    Temp (!) 100.4 F (38 C) (Oral)    Resp 18    LMP 11/01/2018    SpO2 100%   Physical Exam Vitals signs and nursing note reviewed.  Constitutional:      General: She is not in acute distress.    Appearance: She is not toxic-appearing or diaphoretic.  HENT:     Head: Normocephalic and atraumatic.     Nose: Nose normal.     Mouth/Throat:     Mouth: Mucous membranes are dry.  Eyes:     General: No scleral icterus.    Conjunctiva/sclera: Conjunctivae normal.  Neck:     Musculoskeletal: Normal range of motion.  Cardiovascular:     Rate and Rhythm: Regular rhythm.     Pulses: Normal pulses.          Radial pulses are 2+ on the right side and 2+ on the left side.     Heart sounds: Normal heart sounds.     Comments: Tachycardic to 128 on initial exam Pulmonary:     Effort: Pulmonary effort is normal. No respiratory distress.     Breath sounds: Normal breath sounds. No wheezing, rhonchi or rales.  Abdominal:     General: Bowel sounds are normal. There is no distension.     Palpations: Abdomen is soft.     Tenderness: There is abdominal tenderness. There is no right CVA tenderness, left CVA tenderness, guarding or rebound.     Comments: Diffusely tender to palpation in all quadrants.  Musculoskeletal: Normal range of motion.      Right lower leg: No edema.     Left lower leg: No edema.  Skin:    General: Skin is warm and dry.     Capillary Refill: Capillary refill takes less than 2 seconds.     Findings: No rash.  Neurological:     Mental Status: She is alert and oriented to person, place, and time.  Psychiatric:  Behavior: Behavior normal.      ED Treatments / Results  Labs (all labs ordered are listed, but only abnormal results are displayed) Labs Reviewed  COMPREHENSIVE METABOLIC PANEL - Abnormal; Notable for the following components:      Result Value   Potassium 2.8 (*)    CO2 20 (*)    Glucose, Bld 203 (*)    BUN 5 (*)    Calcium 8.7 (*)    Albumin 3.0 (*)    AST 12 (*)    All other components within normal limits  CBC WITH DIFFERENTIAL/PLATELET - Abnormal; Notable for the following components:   WBC 12.5 (*)    Hemoglobin 10.7 (*)    MCV 75.6 (*)    MCH 22.5 (*)    MCHC 29.7 (*)    RDW 15.8 (*)    Platelets 426 (*)    Neutro Abs 10.7 (*)    All other components within normal limits  URINALYSIS, ROUTINE W REFLEX MICROSCOPIC - Abnormal; Notable for the following components:   APPearance HAZY (*)    Glucose, UA 50 (*)    Hgb urine dipstick MODERATE (*)    Ketones, ur 80 (*)    Protein, ur 100 (*)    RBC / HPF >50 (*)    Bacteria, UA RARE (*)    All other components within normal limits  CULTURE, BLOOD (ROUTINE X 2)  CULTURE, BLOOD (ROUTINE X 2)  LACTIC ACID, PLASMA  PREGNANCY, URINE  LACTIC ACID, PLASMA    EKG EKG Interpretation  Date/Time:  Wednesday November 04 2018 11:37:30 EDT Ventricular Rate:  128 PR Interval:    QRS Duration: 80 QT Interval:  288 QTC Calculation: 421 R Axis:   50 Text Interpretation:  Sinus tachycardia Borderline T abnormalities, diffuse leads Confirmed by Gerlene Fee [phone]) on 11/04/2018 1:06:51 PM   Radiology Ct Abdomen Pelvis W Contrast  Result Date: 11/04/2018 CLINICAL DATA:  Painful cramps 4 days. History of fibroids removed 2  months ago. EXAM: CT ABDOMEN AND PELVIS WITH CONTRAST TECHNIQUE: Multidetector CT imaging of the abdomen and pelvis was performed using the standard protocol following bolus administration of intravenous contrast. CONTRAST:  172mL OMNIPAQUE IOHEXOL 300 MG/ML  SOLN COMPARISON:  09/10/2018 and 04/23/2018 FINDINGS: Lower chest: Lung bases are normal. Hepatobiliary: Liver, gallbladder and biliary tree are normal. Pancreas: Normal. Spleen: Normal. Adrenals/Urinary Tract: Adrenal glands are normal. Kidneys normal in size without hydronephrosis or nephrolithiasis. Ureters and bladder are normal. Stomach/Bowel: Stomach is within normal. Mild prominence of a few small bowel loops over the left upper quadrant measuring up to 3.1 cm in diameter. Appendix is within normal. Colon is decompressed from the hepatic flexure distally to the rectum. There is mild wall thickening and mild adjacent plantar and change involving the junction of the descending colon to sigmoid colon as findings could be seen with mild acute diverticulitis. No evidence of perforation or adjacent abscess. Vascular/Lymphatic: Vascular structures are within normal. There are several small periaortic and mesenteric lymph nodes slightly worse and likely reactive. Reproductive: Evidence of patient's recent uterine fibroid resection 2 months ago. There are several residual uterine fibroids present. There is interval enlargement of a fluid collection containing several small foci of air over the uterine body measuring approximately 3.1 x 4.2 x 5.5 cm in AP, transverse and craniocaudal dimension which is slightly larger. This may represent chronic postoperative infection/abscess versus noninfectious postoperative changes or central necrosis of residual fibroid. Other: Small amount of free fluid present. Postsurgical change over  the midline abdominopelvic wall. Musculoskeletal: Unchanged. IMPRESSION: Evidence patient's recent uterine fibroid resection 2 months ago.  Multiple residual uterine fibroids present. Slight interval enlargement of a fluid collection over the uterine body with a few associated foci of air measuring 3.1 x 4.2 x 5.5 cm. This may represent an infected fluid collection versus noninfected postoperative collection or central necrosis of a residual fibroid. Mild focal wall thickening with adjacent inflammatory change involving the junction of the descending to sigmoid colon in the left lower quadrant suggesting mild acute diverticulitis. No perforation or abscess. Mild prominence of several small bowel loops in the left upper quadrant measuring up to 3.1 cm likely ileus. Electronically Signed   By: Marin Olp M.D.   On: 11/04/2018 14:40    Procedures Procedures (including critical care time)  Medications Ordered in ED Medications  0.9 %  sodium chloride infusion (1,000 mLs Intravenous New Bag/Given 11/04/18 1236)  metroNIDAZOLE (FLAGYL) IVPB 500 mg (has no administration in time range)  levofloxacin (LEVAQUIN) IVPB 750 mg (has no administration in time range)  potassium chloride 10 mEq in 100 mL IVPB (10 mEq Intravenous New Bag/Given 11/04/18 1443)  acetaminophen (TYLENOL) tablet 650 mg (has no administration in time range)  levofloxacin (LEVAQUIN) IVPB 750 mg (0 mg Intravenous Stopped 11/04/18 1446)  sodium chloride 0.9 % bolus 1,000 mL (1,000 mLs Intravenous New Bag/Given 11/04/18 1443)  iohexol (OMNIPAQUE) 300 MG/ML solution 100 mL (100 mLs Intravenous Contrast Given 11/04/18 1352)     Initial Impression / Assessment and Plan / ED Course  I have reviewed the triage vital signs and the nursing notes.  Pertinent labs & imaging results that were available during my care of the patient were reviewed by me and considered in my medical decision making (see chart for details).  Patient is 33 year old female presenting with fever and abdominal pain.  She had recent myomectomy on 08/25/2018 for uterine fibroid removal. Medical records reviewed  and verified pt's surgeon for myomectomy was Dr. Rip Harbour. She was later treated for possible septic pelvic thrombophlebitis with IV antibiotics after post op fever On arrival today  patient is febrile to 100.4 and tachycardic 128.  She is not tachypneic or hypoxic. Her blood pressure on arrival is normotensive. Patient's abdomen is diffusely tender on exam. Code sepsis initiated at 1200 after speaking with my attending Dr. Sedonia Small.  Will start antibiotics for possible intra-abdominal source. Pt has penicillin allergy with unknown reaction, so IV flagyl and levaquin started. PO tylenol given for fever. Will reassess.   CT abdomen pelvis viewed by me shows fluid over the uterine body, possible diverticulitis, possible ileus. Lab work is significant for hypokalemia 2.8, replaced with IV potassium. Also leukocytosis of 12.5. Hemoglobin appears stable at 10.7, this is improved since 1 month ago when she had values ranging from 7-9. Lactic acid is 1.5, within normal limits. Blood cultures pending. On reassessment, pt's abdomen is still tender, tachycardia and temperature have not improved. This case was discussed with Dr. Sedonia Small who has seen the patient and agrees with plan to admit for intra-abdominal fluid collection with possible chronic abscess vs infection.   Case discussed with Dr. Rip Harbour with obgyn who agrees to assume care of patient and bring into the hospital for further evaluation and management.  Vitals:   11/04/18 1530 11/04/18 1545 11/04/18 1746 11/04/18 1806  BP: 129/82 132/77 117/75 123/81  Pulse:   (!) 110 (!) 118  Resp: 18 18 18 18   Temp:   (!) 101.5 F (38.6 C)  99.5 F (37.5 C)  TempSrc:   Oral Oral  SpO2:   100% 100%  Weight:      Height:         This note was prepared with assistance of Systems analyst. Occasional wrong-word or sound-a-like substitutions may have occurred due to the inherent limitations of voice recognition software.      Final Clinical  Impressions(s) / ED Diagnoses   Final diagnoses:  Infection in abdomen ([place])  Hypokalemia  Fever, unspecified fever cause    ED Discharge Orders    None       Cherre Robins, PA-C 11/04/18 1933    Cherre Robins, PA-C 11/05/18 1745    Maudie Flakes, MD 11/05/18 [place]

## 2018-11-04 NOTE — H&P (Signed)
Tricia Benitez is an 33 y.o. female presents to ER with c/o fever, abd pain and slight SOB since Sunday. Fever to 104 last evening.  Pt underwent abd myomectomy 09/01/18. Post op course was unremarkable. Was seen for 1 week post op check in office and noted to be doing well. Developed SOB and fever on 09/10/18 and was admitted for the same. W/U was negative. Treated for suspected SPT and Sx resolved. Was seen first of March for follow up and was doing well. Instructed to resume normal ADL's and follow up PRN.  Pt reports was doing well until this past Sunday. Had returned to work without problems. Tolerating diet and no bowel or bladder dysfunction. Cycle March 17 x 5 days and normal.  Cycle started this past Sunday and she developed severe cramps, fever and slight SOB. Min bleeding. Thought constipated and used an enema with some relief.   Continued with Sx, thus came to ER for further eval.  W/U in ER remarkable for CT scan showing intra-abdominal fluid collect ? Chronic abscess or infection. Normal LA. Elevated WBC with left shift noted.  OB/GYN was called for further eval and admission  Pertinent Gynecological History:  Menses: 11/01/18 normal flow and o/w as per HPI Previous GYN Procedures: abd myomectomy 2/20     Menstrual History: Menarche age: 11 Patient's last menstrual period was 11/01/2018.    Past Medical History:  Diagnosis Date  . Anemia   . Asthma   . Diabetes mellitus without complication (HCC)    Type II  . UTI (urinary tract infection)     Past Surgical History:  Procedure Laterality Date  . BREAST BIOPSY Left 2015   cyst removed.   Marland Kitchen MOUTH SURGERY     teeth ext   . MYOMECTOMY N/A 09/01/2018   Procedure: ABDOMINAL MYOMECTOMY;  Surgeon: Chancy Milroy, MD;  Location: [facility];  Service: Gynecology;  Laterality: N/A;  . TONSILLECTOMY      Family History  Problem Relation Age of Onset  . Fibroids Mother   . Asthma Mother   . Diabetes Father   . Kidney  disease Father   . Alcohol abuse Father   . Diabetes Paternal Grandmother   . Hyperlipidemia Paternal Grandmother   . Hypertension Paternal Grandmother   . Alcohol abuse Maternal Grandmother     Social History:  reports that she has never smoked. She has never used smokeless tobacco. She reports current alcohol use. She reports that she does not use drugs.  Allergies:  Allergies  Allergen Reactions  . Penicillins Other (See Comments)    Do not remember symptoms but was told DO NOT TAKE.   Marland Kitchen Glipizide Hives    (Not in a hospital admission)   Review of Systems  Constitutional: Positive for fever.  Respiratory: Positive for shortness of breath.   Cardiovascular: Negative.   Gastrointestinal: Positive for abdominal pain and constipation.  Genitourinary: Negative.     Blood pressure 132/77, pulse (!) 123, temperature (!) 100.5 F (38.1 C), temperature source Oral, resp. rate 18, height 5\' 5"  (1.651 m), weight 72.6 kg, last menstrual period 11/01/2018, SpO2 100 %. Physical Exam  Constitutional: She appears well-developed and well-nourished.  Does not appear to be in any distress  HENT:  Head: Normocephalic and atraumatic.  Neck: Normal range of motion. Neck supple.  Cardiovascular: Normal rate and regular rhythm.  Respiratory: Effort normal and breath sounds normal.  GI: Soft. Bowel sounds are normal.  Fibroid uterus 16 -18 weeks, shifted to  the right, tenderness no rebound midline incision well healed  Genitourinary:    Genitourinary Comments: deferred   Musculoskeletal: Normal range of motion.    Results for orders placed or performed during the hospital encounter of 11/04/18 (from the past 24 hour(s))  Lactic acid, plasma     Status: None   Collection Time: 11/04/18 11:55 AM  Result Value Ref Range   Lactic Acid, Venous 1.5 0.5 - 1.9 mmol/L  Comprehensive metabolic panel     Status: Abnormal   Collection Time: 11/04/18 12:05 PM  Result Value Ref Range   Sodium 135  135 - 145 mmol/L   Potassium 2.8 (L) 3.5 - 5.1 mmol/L   Chloride 100 98 - 111 mmol/L   CO2 20 (L) 22 - 32 mmol/L   Glucose, Bld 203 (H) 70 - 99 mg/dL   BUN 5 (L) 6 - 20 mg/dL   Creatinine, Ser 0.68 0.44 - 1.00 mg/dL   Calcium 8.7 (L) 8.9 - 10.3 mg/dL   Total Protein 7.7 6.5 - 8.1 g/dL   Albumin 3.0 (L) 3.5 - 5.0 g/dL   AST 12 (L) 15 - 41 U/L   ALT 11 0 - 44 U/L   Alkaline Phosphatase 96 38 - 126 U/L   Total Bilirubin 0.9 0.3 - 1.2 mg/dL   GFR calc non Af Amer >60 >60 mL/min   GFR calc Af Amer >60 >60 mL/min   Anion gap 15 5 - 15  CBC WITH DIFFERENTIAL     Status: Abnormal   Collection Time: 11/04/18 12:05 PM  Result Value Ref Range   WBC 12.5 (H) 4.0 - 10.5 K/uL   RBC 4.76 3.87 - 5.11 MIL/uL   Hemoglobin 10.7 (L) 12.0 - 15.0 g/dL   HCT 36.0 36.0 - 46.0 %   MCV 75.6 (L) 80.0 - 100.0 fL   MCH 22.5 (L) 26.0 - 34.0 pg   MCHC 29.7 (L) 30.0 - 36.0 g/dL   RDW 15.8 (H) 11.5 - 15.5 %   Platelets 426 (H) 150 - 400 K/uL   nRBC 0.0 0.0 - 0.2 %   Neutrophils Relative % 85 %   Neutro Abs 10.7 (H) 1.7 - 7.7 K/uL   Lymphocytes Relative 10 %   Lymphs Abs 1.2 0.7 - 4.0 K/uL   Monocytes Relative 5 %   Monocytes Absolute 0.6 0.1 - 1.0 K/uL   Eosinophils Relative 0 %   Eosinophils Absolute 0.0 0.0 - 0.5 K/uL   Basophils Relative 0 %   Basophils Absolute 0.0 0.0 - 0.1 K/uL   Immature Granulocytes 0 %   Abs Immature Granulocytes 0.05 0.00 - 0.07 K/uL  Urinalysis, Routine w reflex microscopic     Status: Abnormal   Collection Time: 11/04/18 12:28 PM  Result Value Ref Range   Color, Urine YELLOW YELLOW   APPearance HAZY (A) CLEAR   Specific Gravity, Urine 1.020 1.005 - 1.030   pH 6.0 5.0 - 8.0   Glucose, UA 50 (A) NEGATIVE mg/dL   Hgb urine dipstick MODERATE (A) NEGATIVE   Bilirubin Urine NEGATIVE NEGATIVE   Ketones, ur 80 (A) NEGATIVE mg/dL   Protein, ur 100 (A) NEGATIVE mg/dL   Nitrite NEGATIVE NEGATIVE   Leukocytes,Ua NEGATIVE NEGATIVE   RBC / HPF >50 (H) 0 - 5 RBC/hpf   WBC, UA  0-5 0 - 5 WBC/hpf   Bacteria, UA RARE (A) NONE SEEN   Squamous Epithelial / LPF 0-5 0 - 5   Mucus PRESENT   Pregnancy, urine  Status: None   Collection Time: 11/04/18 12:28 PM  Result Value Ref Range   Preg Test, Ur NEGATIVE NEGATIVE    Ct Abdomen Pelvis W Contrast  Result Date: 11/04/2018 CLINICAL DATA:  Painful cramps 4 days. History of fibroids removed 2 months ago. EXAM: CT ABDOMEN AND PELVIS WITH CONTRAST TECHNIQUE: Multidetector CT imaging of the abdomen and pelvis was performed using the standard protocol following bolus administration of intravenous contrast. CONTRAST:  135mL OMNIPAQUE IOHEXOL 300 MG/ML  SOLN COMPARISON:  09/10/2018 and 04/23/2018 FINDINGS: Lower chest: Lung bases are normal. Hepatobiliary: Liver, gallbladder and biliary tree are normal. Pancreas: Normal. Spleen: Normal. Adrenals/Urinary Tract: Adrenal glands are normal. Kidneys normal in size without hydronephrosis or nephrolithiasis. Ureters and bladder are normal. Stomach/Bowel: Stomach is within normal. Mild prominence of a few small bowel loops over the left upper quadrant measuring up to 3.1 cm in diameter. Appendix is within normal. Colon is decompressed from the hepatic flexure distally to the rectum. There is mild wall thickening and mild adjacent plantar and change involving the junction of the descending colon to sigmoid colon as findings could be seen with mild acute diverticulitis. No evidence of perforation or adjacent abscess. Vascular/Lymphatic: Vascular structures are within normal. There are several small periaortic and mesenteric lymph nodes slightly worse and likely reactive. Reproductive: Evidence of patient's recent uterine fibroid resection 2 months ago. There are several residual uterine fibroids present. There is interval enlargement of a fluid collection containing several small foci of air over the uterine body measuring approximately 3.1 x 4.2 x 5.5 cm in AP, transverse and craniocaudal dimension  which is slightly larger. This may represent chronic postoperative infection/abscess versus noninfectious postoperative changes or central necrosis of residual fibroid. Other: Small amount of free fluid present. Postsurgical change over the midline abdominopelvic wall. Musculoskeletal: Unchanged. IMPRESSION: Evidence patient's recent uterine fibroid resection 2 months ago. Multiple residual uterine fibroids present. Slight interval enlargement of a fluid collection over the uterine body with a few associated foci of air measuring 3.1 x 4.2 x 5.5 cm. This may represent an infected fluid collection versus noninfected postoperative collection or central necrosis of a residual fibroid. Mild focal wall thickening with adjacent inflammatory change involving the junction of the descending to sigmoid colon in the left lower quadrant suggesting mild acute diverticulitis. No perforation or abscess. Mild prominence of several small bowel loops in the left upper quadrant measuring up to 3.1 cm likely ileus. Electronically Signed   By: Marin Olp M.D.   On: 11/04/2018 14:40    Assessment/Plan: Intra abdominal fluid collection ? Chronic abscess vs infection Hypokalemia DM  Will admit pt for IV antibiotics. Replace K. BC x 2 obtained in ER. Discuss CT results with radiology and compare to CT scan from 09/10/18. Possible [place] vs CT guided drainage. POC reviewed with pt. Pt verbalized understanding  Chancy Milroy 11/04/2018, 5:07 PM

## 2018-11-04 NOTE — Progress Notes (Signed)
Patient complain of shortness of breath and tingling to left side of face vital signs are within normal limits.pPhysician on call was notified orders were to continue to monitor patient for any further changes and notify her if they occur.

## 2018-11-04 NOTE — Plan of Care (Signed)

## 2018-11-04 NOTE — ED Notes (Signed)
Portable heart monitor placed on pt

## 2018-11-04 NOTE — Progress Notes (Signed)
Pharmacy Antibiotic Note  Tricia Benitez is a 33 y.o. female admitted on 11/04/2018 with intra-abdominal infection. Pharmacy has been consulted for levaquin dosing. Pt with Tmax 100.4 and WBC is elevated at 12.5. Scr and lactic acid are WNL.   Plan: Levaquin 750mg  IV Q24h F/u renal fxn, C&S, clinical status   Height: 5\' 5"  (165.1 cm) Weight: 161 lb 6 oz (73.2 kg) IBW/kg (Calculated) : 57  Temp (24hrs), Avg:100.2 F (37.9 C), Min:99.9 F (37.7 C), Max:100.4 F (38 C)  No results for input(s): WBC, CREATININE, LATICACIDVEN, VANCOTROUGH, VANCOPEAK, VANCORANDOM, GENTTROUGH, GENTPEAK, GENTRANDOM, TOBRATROUGH, TOBRAPEAK, TOBRARND, AMIKACINPEAK, AMIKACINTROU, AMIKACIN in the last 168 hours.  CrCl cannot be calculated (Patient's most recent lab result is older than the maximum 21 days allowed.).    Allergies  Allergen Reactions  . Penicillins Other (See Comments)    Do not remember symptoms but was told DO NOT TAKE.   Marland Kitchen Glipizide Hives    Antimicrobials this admission: Levaquin 4/15>> Flagyl x 1 4/15  Dose adjustments this admission: N/A  Microbiology results: Pending Thank you for allowing pharmacy to be a part of this patient's care.  Harlei Lehrmann, Rande Lawman 11/04/2018 12:14 PM

## 2018-11-04 NOTE — ED Triage Notes (Signed)
Pt presents with abdominal pain, fever, and fatigue for about a week.  Pt had a surgery 2 months ago to have fibroid tumors removed and has had these symptoms since her recent period.

## 2018-11-05 ENCOUNTER — Encounter (HOSPITAL_COMMUNITY): Payer: Self-pay | Admitting: General Practice

## 2018-11-05 LAB — POTASSIUM: Potassium: 2.8 mmol/L — ABNORMAL LOW (ref 3.5–5.1)

## 2018-11-05 LAB — GLUCOSE, CAPILLARY
Glucose-Capillary: 165 mg/dL — ABNORMAL HIGH (ref 70–99)
Glucose-Capillary: 147 mg/dL — ABNORMAL HIGH (ref 70–99)
Glucose-Capillary: 174 mg/dL — ABNORMAL HIGH (ref 70–99)

## 2018-11-05 MED ORDER — SCOPOLAMINE 1 MG/3DAYS TD PT72
1.0000 | MEDICATED_PATCH | Freq: Once | TRANSDERMAL | Status: DC
  Filled 2018-11-05: qty 1

## 2018-11-05 MED ORDER — INSULIN ASPART 100 UNIT/ML ~~LOC~~ SOLN: 0.0000 [IU] | Freq: Every day | SUBCUTANEOUS | Status: DC

## 2018-11-05 MED ORDER — POTASSIUM CHLORIDE CRYS ER 20 MEQ PO TBCR
20.0000 meq | EXTENDED_RELEASE_TABLET | Freq: Two times a day (BID) | ORAL | Status: AC
  Administered 2018-11-05 – 2018-11-06 (×3): 20 meq via ORAL
  Filled 2018-11-05 (×4): qty 1

## 2018-11-05 MED ORDER — POTASSIUM CHLORIDE 10 MEQ/100ML IV SOLN
10.0000 meq | INTRAVENOUS | Status: AC
  Administered 2018-11-05 (×2): 10 meq via INTRAVENOUS
  Filled 2018-11-05 (×2): qty 100

## 2018-11-05 MED ORDER — INSULIN ASPART 100 UNIT/ML ~~LOC~~ SOLN
0.0000 [IU] | Freq: Three times a day (TID) | SUBCUTANEOUS | Status: DC
  Administered 2018-11-05: 3 [IU] via SUBCUTANEOUS
  Administered 2018-11-05 – 2018-11-06 (×2): 2 [IU] via SUBCUTANEOUS
  Administered 2018-11-06: 3 [IU] via SUBCUTANEOUS
  Administered 2018-11-07 (×3): 2 [IU] via SUBCUTANEOUS

## 2018-11-05 MED ORDER — ALBUTEROL SULFATE (2.5 MG/3ML) 0.083% IN NEBU
2.5000 mg | INHALATION_SOLUTION | Freq: Four times a day (QID) | RESPIRATORY_TRACT | Status: DC | PRN
  Administered 2018-11-05: 2.5 mg via RESPIRATORY_TRACT
  Filled 2018-11-05: qty 3

## 2018-11-05 MED ORDER — ENOXAPARIN SODIUM 40 MG/0.4ML ~~LOC~~ SOLN: 40.0000 mg | SUBCUTANEOUS | Status: DC

## 2018-11-05 NOTE — Progress Notes (Signed)
IR consulted for intra-abdominal fluid collection aspiration and drainage.  Dr. Anselm Pancoast has reviewed images.  Plan for possible drain placement tomorrow pending further review.  NPO p MN Hold lovenox.   RN aware.  Brynda Greathouse, MS RD PA-C

## 2018-11-05 NOTE — Progress Notes (Signed)
Patient ID: Tricia Benitez, female   DOB: 1985-11-12, 33 y.o.   MRN: [id number] Spoke with Dr Catalina Antigua from ID After review of pt's current Sx, he feels she is low risk for COVID 19 and does not recommend testing.

## 2018-11-05 NOTE — Plan of Care (Signed)
  Problem: Education: Goal: Knowledge of General Education information will improve Description: Including pain rating scale, medication(s)/side effects and non-pharmacologic comfort measures Outcome: Progressing   Problem: Activity: Goal: Risk for activity intolerance will decrease Outcome: Progressing   Problem: Pain Managment: Goal: General experience of comfort will improve Outcome: Progressing   

## 2018-11-05 NOTE — Progress Notes (Signed)
HD # 1 Intra Abd fluid collection Subjective: Patient reports feeling about the same. Still with some abd pain. Episode of SOB last evening resolved with breathing treatment. Has had 2 episodes of loose stool. Relates to starting Flagyl. Tolerating diet but not much of an appetite.    Objective: Tm 101.5 1800 on 4/15 O2 sat 100% on RA VSS Lungs clear Heart RRR abd soft + BS enlarged uterus as previously noted, tenderness unchanged GU slight menses Ext non tender    Assessment/Plan: Intra Abd fluid collection Hypokalemia   Discussed with CT scans with Dr Derrel Nip. Will consult IR for eval of possible guidance drainage. Will replete K. Repeat labs in AM. Continue with IV antibiotics Will discuss with ID if COVID 19 testing is warranted due to Sx   LOS: 1 day    Chancy Milroy 11/05/2018, 1:15 PM

## 2018-11-05 NOTE — Progress Notes (Signed)
Patient declined 2 runs of her Potassium despite explanation, MD made aware.

## 2018-11-05 NOTE — Progress Notes (Signed)
Inpatient Diabetes Program Recommendations  AACE/ADA: New Consensus Statement on Inpatient Glycemic Control (2015)  Target Ranges:  Prepandial:   less than 140 mg/dL      Peak postprandial:   less than 180 mg/dL (1-2 hours)      Critically ill patients:  140 - 180 mg/dL   Lab Results  Component Value Date   GLUCAP 184 (H) 09/16/2018   HGBA1C 8.4 (H) 07/24/2018    Review of Glycemic Control Results for [name], [name] (MRN [id number]) as of 11/05/2018 09:11  Ref. Range 11/04/2018 12:05  Glucose Latest Ref Range: 70 - 99 mg/dL 203 (H)   Diabetes history: Type 2 DM Outpatient Diabetes medications: Metformin 500 mg BID Current orders for Inpatient glycemic control: none  Inpatient Diabetes Program Recommendations:    Consider adding A1C, last result was from January 2020. Additionally, glucose serum was 203 mg/dL, consider adding Novolog 0-9 units TID & HS.  Thanks, Bronson Curb, MSN, RNC-OB Diabetes Coordinator [phone] (8a-5p)

## 2018-11-05 NOTE — Progress Notes (Signed)
Patient will now allow RN to give 2 runs of potassium, which will total the 4 runs originally ordered.  States that she was emotional and upset about being in the hospital.

## 2018-11-05 NOTE — Progress Notes (Signed)
Patient refused her IV fluids, requested to be saline locked.

## 2018-11-06 ENCOUNTER — Inpatient Hospital Stay (HOSPITAL_COMMUNITY): Payer: No Typology Code available for payment source

## 2018-11-06 LAB — CBC WITH DIFFERENTIAL/PLATELET
Abs Immature Granulocytes: 0.09 10*3/uL — ABNORMAL HIGH (ref 0.00–0.07)
Basophils Absolute: 0 10*3/uL (ref 0.0–0.1)
Basophils Relative: 0 %
Eosinophils Absolute: 0.1 10*3/uL (ref 0.0–0.5)
Eosinophils Relative: 1 %
HCT: 30.3 % — ABNORMAL LOW (ref 36.0–46.0)
Hemoglobin: 9.3 g/dL — ABNORMAL LOW (ref 12.0–15.0)
Immature Granulocytes: 1 %
Lymphocytes Relative: 10 %
Lymphs Abs: 1.3 10*3/uL (ref 0.7–4.0)
MCH: 22.5 pg — ABNORMAL LOW (ref 26.0–34.0)
MCHC: 30.7 g/dL (ref 30.0–36.0)
MCV: 73.2 fL — ABNORMAL LOW (ref 80.0–100.0)
Monocytes Absolute: 0.8 10*3/uL (ref 0.1–1.0)
Monocytes Relative: 6 %
Neutro Abs: 10.8 10*3/uL — ABNORMAL HIGH (ref 1.7–7.7)
Neutrophils Relative %: 82 %
Platelets: 414 10*3/uL — ABNORMAL HIGH (ref 150–400)
RBC: 4.14 MIL/uL (ref 3.87–5.11)
RDW: 16.2 % — ABNORMAL HIGH (ref 11.5–15.5)
WBC: 13 10*3/uL — ABNORMAL HIGH (ref 4.0–10.5)
nRBC: 0 % (ref 0.0–0.2)

## 2018-11-06 LAB — BASIC METABOLIC PANEL
Anion gap: 12 (ref 5–15)
BUN: <5 mg/dL — ABNORMAL LOW (ref 6–20)
CO2: 17 mmol/L — ABNORMAL LOW (ref 22–32)
Calcium: 8.1 mg/dL — ABNORMAL LOW (ref 8.9–10.3)
Chloride: 108 mmol/L (ref 98–111)
Creatinine, Ser: 0.55 mg/dL (ref 0.44–1.00)
GFR calc Af Amer: >60 mL/min (ref >60)
GFR calc non Af Amer: >60 mL/min (ref >60)
Glucose, Bld: 193 mg/dL — ABNORMAL HIGH (ref 70–99)
Potassium: 3.7 mmol/L (ref 3.5–5.1)
Sodium: 137 mmol/L (ref 135–145)

## 2018-11-06 LAB — PROTIME-INR
INR: 1.2 (ref 0.8–1.2)
Prothrombin Time: 15.1 seconds (ref 11.4–15.2)

## 2018-11-06 LAB — GLUCOSE, CAPILLARY
Glucose-Capillary: 135 mg/dL — ABNORMAL HIGH (ref 70–99)
Glucose-Capillary: 154 mg/dL — ABNORMAL HIGH (ref 70–99)
Glucose-Capillary: 137 mg/dL — ABNORMAL HIGH (ref 70–99)

## 2018-11-06 MED ORDER — FENTANYL CITRATE (PF) 100 MCG/2ML IJ SOLN
INTRAMUSCULAR | Status: AC | PRN
  Administered 2018-11-06: 50 ug via INTRAVENOUS
  Administered 2018-11-06 (×2): 25 ug via INTRAVENOUS

## 2018-11-06 MED ORDER — MIDAZOLAM HCL 2 MG/2ML IJ SOLN
INTRAMUSCULAR | Status: AC
  Filled 2018-11-06: qty 2

## 2018-11-06 MED ORDER — ACETAMINOPHEN 325 MG PO TABS
650.0000 mg | ORAL_TABLET | ORAL | Status: DC | PRN
  Administered 2018-11-06: 650 mg via ORAL
  Filled 2018-11-06: qty 2

## 2018-11-06 MED ORDER — SODIUM CHLORIDE 0.9% FLUSH
5.0000 mL | Freq: Three times a day (TID) | INTRAVENOUS | Status: DC
  Administered 2018-11-06 – 2018-11-07 (×2): 5 mL

## 2018-11-06 MED ORDER — FENTANYL CITRATE (PF) 100 MCG/2ML IJ SOLN
INTRAMUSCULAR | Status: AC
  Filled 2018-11-06: qty 2

## 2018-11-06 MED ORDER — MIDAZOLAM HCL 2 MG/2ML IJ SOLN
INTRAMUSCULAR | Status: AC | PRN
  Administered 2018-11-06: 0.5 mg via INTRAVENOUS
  Administered 2018-11-06: 1 mg via INTRAVENOUS

## 2018-11-06 MED ORDER — LEVOFLOXACIN IN D5W 500 MG/100ML IV SOLN
500.0000 mg | INTRAVENOUS | Status: DC
  Administered 2018-11-06 – 2018-11-07 (×2): 500 mg via INTRAVENOUS
  Filled 2018-11-06 (×3): qty 100

## 2018-11-06 NOTE — Procedures (Signed)
    Interventional Radiology Procedure:   Indications: Pelvic fluid collection around uterus with inflammatory changes  Procedure: CT guided drain placement in pelvic fluid collection  Findings: Purulent yellow fluid removed from pelvic collection and 10 Fr drain placed.  Complications: None     EBL: Less than 10 ml  Plan: Send fluid for collection, follow output.     Andreea Arca R. Anselm Pancoast, MD  Pager: [phone]

## 2018-11-06 NOTE — Plan of Care (Signed)
Problem: Education: Goal: Knowledge of General Education information will improve Description Including pain rating scale, medication(s)/side effects and non-pharmacologic comfort measures Outcome: Progressing   Problem: Health Behavior/Discharge Planning: Goal: Ability to manage health-related needs will improve Outcome: Progressing   Problem: Clinical Measurements: Goal: Respiratory complications will improve Outcome: Progressing Goal: Cardiovascular complication will be avoided Outcome: Progressing   Problem: Activity: Goal: Risk for activity intolerance will decrease Outcome: Progressing   Problem: Nutrition: Goal: Adequate nutrition will be maintained Outcome: Progressing   Problem: Coping: Goal: Level of anxiety will decrease Outcome: Progressing   Problem: Pain Managment: Goal: General experience of comfort will improve Outcome: Progressing   Problem: Safety: Goal: Ability to remain free from injury will improve Outcome: Progressing   Problem: Skin Integrity: Goal: Risk for impaired skin integrity will decrease Outcome: Progressing   

## 2018-11-06 NOTE — Progress Notes (Signed)
Fever to 102 with tachycardia to 128. Will add tylenol. On antibiotics, so cultures unlikely to assist, will continue tx as polymicrobial infection.

## 2018-11-06 NOTE — Progress Notes (Signed)
HD # 2 Intra abd fluid collection Subjective: Patient reports feeling better today. Pain has improved. Cramps are less. Tolerating diet and appetite is improving. Menses is down to spotting now.    Objective: Tm 100.7 (0400 on 11/05/18) AF since  Lungs clear Heart RRR Abd soft + BS tenderness decreased drain in place with purulent discharge GU deferred Ext non tender  Assessment/Plan: Intra abd fluid collection, S/P placement of drain by IR Hypokalemia, resolved.  Drain placed today. Fluid sent for culture. AF x 24 hours now. BC x 2 negative.  Will continue with IV antibiotics. Appreciate IR helps in Ms Muzio's care. If continues to improve and remain stable, possible discharge home on Sunday.   LOS: 2 days    Chancy Milroy 11/06/2018, 2:49 PM

## 2018-11-06 NOTE — Progress Notes (Signed)
Pt returned to room 6N13 from IR. Received report from [place], Therapist, sports. See reassessment. Will continue to monitor.

## 2018-11-06 NOTE — Progress Notes (Signed)
Noted elevated temp- HR- jumped up simultaneously  Started at the time tech entered to check VS pt noted strong perfume from her and believes it triggered a reaction- got cool wash rag to apply to her head- notified on call phys- obtained order for tylenol. Temp rechecked just prior to administering tylenol by another tech which found resulted- 98- HR- has also slowed down but still currently 115.tylenol still given and will monitor

## 2018-11-06 NOTE — Consult Note (Signed)
Chief Complaint: Patient was seen in consultation today for intra-abdominal fluid collection  Referring Physician(s): Dr. Rip Harbour  Supervising Physician: Markus Daft  Patient Status: [facility] - In-pt  History of Present Illness: Tricia Benitez is a 33 y.o. female with past medical history of anemia, asthma, DM, and uterine fibroids who underwent myomectomy 08/31/18 with Dr. Rip Harbour.  Patient presented to [facility] ED with fever and abdominal 11/04/18.   CT Abdomen/Pelvis showed: Evidence patient's recent uterine fibroid resection 2 months ago. Multiple residual uterine fibroids present. Slight interval enlargement of a fluid collection over the uterine body with a few associated foci of air measuring 3.1 x 4.2 x 5.5 cm. This may represent an infected fluid collection versus noninfected postoperative collection or central necrosis of a residual fibroid.  IR consulted for aspiration and drainage of intra-abdominal fluid collection.  Care reviewed and approved by Dr. Anselm Pancoast.   Past Medical History:  Diagnosis Date   Anemia    Asthma    Diabetes mellitus without complication ([place])    Type II   UTI (urinary tract infection)     Past Surgical History:  Procedure Laterality Date   BREAST BIOPSY Left 2015   cyst removed.    MOUTH SURGERY     teeth ext    MYOMECTOMY N/A 09/01/2018   Procedure: ABDOMINAL MYOMECTOMY;  Surgeon: Chancy Milroy, MD;  Location: [facility];  Service: Gynecology;  Laterality: N/A;   TONSILLECTOMY      Allergies: Penicillins and Glipizide  Medications: Prior to Admission medications   Medication Sig Start Date End Date Taking? Authorizing Provider  acetaminophen (TYLENOL) 500 MG tablet Take 1,000 mg by mouth every 6 (six) hours as needed for mild pain or fever.   Yes [provider]  Cetirizine HCl 10 MG TBDP Take 10 mg by mouth daily.   Yes [provider]  metFORMIN (GLUCOPHAGE) 500 MG tablet Take by mouth 2 (two) times daily with a  meal.   Yes [provider]  Multiple Vitamins-Minerals (MULTIVITAMIN WITH MINERALS) tablet Take 1 tablet by mouth daily.   Yes [provider]     Family History  Problem Relation Age of Onset   Fibroids Mother    Asthma Mother    Diabetes Father    Kidney disease Father    Alcohol abuse Father    Diabetes Paternal Grandmother    Hyperlipidemia Paternal Grandmother    Hypertension Paternal Grandmother    Alcohol abuse Maternal Grandmother     Social History   Socioeconomic History   Marital status: Single    Spouse name: Not on file   Number of children: 0   Years of education: Not on file   Highest education level: Not on file  Occupational History   Not on file  Social Needs   Financial resource strain: Not on file   Food insecurity:    Worry: Not on file    Inability: Not on file   Transportation needs:    Medical: Not on file    Non-medical: Not on file  Tobacco Use   Smoking status: Never Smoker   Smokeless tobacco: Never Used  Substance and Sexual Activity   Alcohol use: Yes    Comment: social   Drug use: Never   Sexual activity: Yes    Birth control/protection: Other-see comments    Comment: female partner  Lifestyle   Physical activity:    Days per week: Not on file    Minutes per session: Not on  file   Stress: Not on file  Relationships   Social connections:    Talks on phone: Not on file    Gets together: Not on file    Attends religious service: Not on file    Active member of club or organization: Not on file    Attends meetings of clubs or organizations: Not on file    Relationship status: Not on file  Other Topics Concern   Not on file  Social History Narrative   Not on file     Review of Systems: A 12 point ROS discussed and pertinent positives are indicated in the HPI above.  All other systems are negative.  Review of Systems  Constitutional: Positive for fever.  Respiratory: Positive  for cough and shortness of breath. Negative for chest tightness.   Cardiovascular: Negative for chest pain.  Gastrointestinal: Positive for abdominal pain. Negative for nausea and vomiting.  Genitourinary: Positive for pelvic pain.  Musculoskeletal: Negative for back pain.  Psychiatric/Behavioral: Negative for behavioral problems and confusion.    Vital Signs: BP 128/86 (BP Location: Right Arm)    Pulse (!) 111    Temp 99.3 F (37.4 C) (Oral)    Resp 13    Ht 5\' 5"  (1.651 m)    Wt 160 lb (72.6 kg)    LMP 11/01/2018    SpO2 100%    BMI 26.63 kg/m   Physical Exam Vitals signs and nursing note reviewed.  Constitutional:      Appearance: Normal appearance.  HENT:     Mouth/Throat:     Mouth: Mucous membranes are moist.     Pharynx: Oropharynx is clear.  Cardiovascular:     Rate and Rhythm: Regular rhythm. Tachycardia present.     Heart sounds: No murmur. No friction rub. No gallop.   Pulmonary:     Effort: Pulmonary effort is normal. No respiratory distress.     Breath sounds: Normal breath sounds.  Abdominal:     General: There is no distension.     Palpations: Abdomen is soft.     Tenderness: There is abdominal tenderness.  Skin:    General: Skin is warm and dry.  Neurological:     General: No focal deficit present.     Mental Status: She is alert and oriented to person, place, and time. Mental status is at baseline.  Psychiatric:        Mood and Affect: Mood normal.        Behavior: Behavior normal.        Thought Content: Thought content normal.        Judgment: Judgment normal.      MD Evaluation Airway: WNL Heart: WNL Abdomen: WNL Chest/ Lungs: WNL ASA  Classification: 3 Mallampati/Airway Score: One   Imaging: Ct Abdomen Pelvis W Contrast  Addendum Date: 11/05/2018   ADDENDUM REPORT: 11/05/2018 11:36 ADDENDUM: Note that there is a second focal fluid collection along the anterior aspect of the mid to lower uterus abutting the abdominal wall just left of  midline measuring 2.9 x 5.5 cm in AP and transverse dimension. This area was previously smaller and contained a few small foci of air on CT 09/10/2018. This may also represent evolving abscess versus necrosis/liquefaction of existing fibroid. The above findings were discussed with Dr. Arlina Robes today at 11:20 a.m. Electronically Signed   By: Marin Olp M.D.   On: 11/05/2018 11:36   Result Date: 11/05/2018 CLINICAL DATA:  Painful cramps 4 days. History of fibroids  removed 2 months ago. EXAM: CT ABDOMEN AND PELVIS WITH CONTRAST TECHNIQUE: Multidetector CT imaging of the abdomen and pelvis was performed using the standard protocol following bolus administration of intravenous contrast. CONTRAST:  144mL OMNIPAQUE IOHEXOL 300 MG/ML  SOLN COMPARISON:  09/10/2018 and 04/23/2018 FINDINGS: Lower chest: Lung bases are normal. Hepatobiliary: Liver, gallbladder and biliary tree are normal. Pancreas: Normal. Spleen: Normal. Adrenals/Urinary Tract: Adrenal glands are normal. Kidneys normal in size without hydronephrosis or nephrolithiasis. Ureters and bladder are normal. Stomach/Bowel: Stomach is within normal. Mild prominence of a few small bowel loops over the left upper quadrant measuring up to 3.1 cm in diameter. Appendix is within normal. Colon is decompressed from the hepatic flexure distally to the rectum. There is mild wall thickening and mild adjacent plantar and change involving the junction of the descending colon to sigmoid colon as findings could be seen with mild acute diverticulitis. No evidence of perforation or adjacent abscess. Vascular/Lymphatic: Vascular structures are within normal. There are several small periaortic and mesenteric lymph nodes slightly worse and likely reactive. Reproductive: Evidence of patient's recent uterine fibroid resection 2 months ago. There are several residual uterine fibroids present. There is interval enlargement of a fluid collection containing several small foci of air  over the uterine body measuring approximately 3.1 x 4.2 x 5.5 cm in AP, transverse and craniocaudal dimension which is slightly larger. This may represent chronic postoperative infection/abscess versus noninfectious postoperative changes or central necrosis of residual fibroid. Other: Small amount of free fluid present. Postsurgical change over the midline abdominopelvic wall. Musculoskeletal: Unchanged. IMPRESSION: Evidence patient's recent uterine fibroid resection 2 months ago. Multiple residual uterine fibroids present. Slight interval enlargement of a fluid collection over the uterine body with a few associated foci of air measuring 3.1 x 4.2 x 5.5 cm. This may represent an infected fluid collection versus noninfected postoperative collection or central necrosis of a residual fibroid. Mild focal wall thickening with adjacent inflammatory change involving the junction of the descending to sigmoid colon in the left lower quadrant suggesting mild acute diverticulitis. No perforation or abscess. Mild prominence of several small bowel loops in the left upper quadrant measuring up to 3.1 cm likely ileus. Electronically Signed: By: Marin Olp M.D. On: 11/04/2018 14:40    Labs:  CBC: Recent Labs    09/12/18 0711 09/15/18 0544 11/04/18 1205 11/06/18 0032  WBC 9.3 8.2 12.5* 13.0*  HGB 10.6* 9.9* 10.7* 9.3*  HCT 32.5* 31.3* 36.0 30.3*  PLT 378 413* 426* 414*    COAGS: Recent Labs    11/06/18 0032  INR 1.2    BMP: Recent Labs    09/10/18 1708 09/12/18 0711 11/04/18 1205 11/05/18 0950 11/06/18 0032  NA 134* 132* 135  --  137  K 3.7 3.9 2.8* 2.8* 3.7  CL 99 102 100  --  108  CO2 26 22 20*  --  17*  GLUCOSE 174* 211* 203*  --  193*  BUN 7 6 5*  --  <5*  CALCIUM 8.6* 8.0* 8.7*  --  8.1*  CREATININE 0.53 0.57 0.68  --  0.55  GFRNONAA >60 >60 >60  --  >60  GFRAA >60 >60 >60  --  >60    LIVER FUNCTION TESTS: Recent Labs    04/22/18 1055 09/10/18 1708 09/12/18 0711  11/04/18 1205  BILITOT 0.4 0.6 1.2 0.9  AST 12 17 25  12*  ALT 9 19 21 11   ALKPHOS 39 73 71 96  PROT 8.0 7.5 6.7 7.7  ALBUMIN 4.2 3.5 3.0* 3.0*    TUMOR MARKERS: No results for input(s): AFPTM, CEA, CA199, CHROMGRNA in the last 8760 hours.  Assessment and Plan: Intra-abdominal fluid collection, suspected abscess s/p recent myomectomy 08/31/18 for uterine fibroids Fluid collection seen on CT imaging.  IR consulted for aspiration and drainage.  Case reviewed and approved by Dr. Anselm Pancoast.  Patient NPO after midnight.  Lovenox held.  Tmax 100.7.  WBC elevated at 13.0.  Currently on Flagyl. Proceed with procedure today.   Risks and benefits discussed with the patient including bleeding, infection, damage to adjacent structures, bowel perforation/fistula connection, and sepsis.  All of the patient's questions were answered, patient is agreeable to proceed. Consent signed and in chart.   Thank you for this interesting consult.  I greatly enjoyed meeting Tricia Benitez and look forward to participating in their care.  A copy of this report was sent to the requesting provider on this date.  Electronically Signed: Docia Barrier, PA 11/06/2018, 12:15 PM   I spent a total of 40 Minutes    in face to face in clinical consultation, greater than 50% of which was counseling/coordinating care for intra-abdominal fluid collection.

## 2018-11-07 LAB — GLUCOSE, CAPILLARY
Glucose-Capillary: 125 mg/dL — ABNORMAL HIGH (ref 70–99)
Glucose-Capillary: 147 mg/dL — ABNORMAL HIGH (ref 70–99)
Glucose-Capillary: 139 mg/dL — ABNORMAL HIGH (ref 70–99)
Glucose-Capillary: 126 mg/dL — ABNORMAL HIGH (ref 70–99)

## 2018-11-07 NOTE — Progress Notes (Signed)
Patient ID: Tricia Benitez, female   DOB: [birth year], 33 y.o.   MRN: [id number] HD # 3 Intra abd fluid collection, S/P percutaneous drain placement  Pt reports feeling better. Pain much improved. No cramps. Menses has stopped. Tolerating diet.  PE TM 102 @ 2000 on 11/06/18 Lungs clear Heart RRR Abd soft + BS uterus enlarged as before non tender now, drain in place Ext non tender  A/P Stable. Fever lasting evening suspect related to recent IR procedure. Fluid culture no growth. Will continue with IV antibiotics. If remains afebrile will plan discharge home tomorrow.

## 2018-11-07 NOTE — Plan of Care (Signed)
  Problem: Pain Managment: Goal: General experience of comfort will improve Outcome: Progressing   Problem: Coping: Goal: Level of anxiety will decrease Outcome: Progressing   Problem: Safety: Goal: Ability to remain free from injury will improve Outcome: Progressing   Problem: Skin Integrity: Goal: Risk for impaired skin integrity will decrease Outcome: Progressing

## 2018-11-08 LAB — GLUCOSE, CAPILLARY: Glucose-Capillary: 118 mg/dL — ABNORMAL HIGH (ref 70–99)

## 2018-11-08 MED ORDER — ALBUTEROL SULFATE HFA 108 (90 BASE) MCG/ACT IN AERS: 2.0000 | INHALATION_SPRAY | Freq: Four times a day (QID) | RESPIRATORY_TRACT | 2 refills | Status: AC | PRN

## 2018-11-08 MED ORDER — CEPHALEXIN 500 MG PO CAPS: 500.0000 mg | ORAL_CAPSULE | Freq: Three times a day (TID) | ORAL | 0 refills | Status: AC

## 2018-11-08 MED ORDER — METRONIDAZOLE 500 MG PO TABS: 500.0000 mg | ORAL_TABLET | Freq: Two times a day (BID) | ORAL | 0 refills | Status: AC

## 2018-11-08 NOTE — Progress Notes (Signed)
Tricia Benitez to be D/C'd per MD order. Discussed with the patient and all questions fully answered. ? VSS, Skin clean, dry and intact without evidence of skin break down, no evidence of skin tears noted. ? IV catheter discontinued intact. Site without signs and symptoms of complications. Dressing and pressure applied. ? An After Visit Summary was printed and given to the patient. Patient informed where to pickup prescriptions. ? D/c education completed with patient/family including follow up instructions, medication list, d/c activities limitations if indicated, with other d/c instructions as indicated by MD - patient able to verbalize understanding, all questions fully answered.  ? Patient instructed to return to ED, call 911, or call MD for any changes in condition.  ? Patient to be escorted via [place], and D/C home via private auto.

## 2018-11-08 NOTE — Discharge Summary (Signed)
Physician Discharge Summary  Patient ID: Tricia Benitez MRN: [id number] DOB/AGE: 32-Apr-1987 32 y.o.  Admit date: 11/04/2018 Discharge date: 11/08/2018  Admission Diagnoses: Intraabdominal abscess  Discharge Diagnoses:  Active Problems:   Intra-abdominal fluid collection   Intraabdominal fluid collection   Discharged Condition: good  Hospital Course: Tricia Benitez was admitted with a Dx of intra-abdominal fluid collection/abscess of the uterus. Suspected to be related to abdominal myomectomy completed in February 2020. She presented with abd pain and fever. CT scan was significant for above fluid collection. Was started on IV antibiotics and underwent CT guidance placement of cathter by IR. See procedure note for additional information. BC and fluid cultures demonstrated no growth. She progressed to being afebrile. Hypokalemia was corrected. Pain resolved. Ambulating, voiding and tolerating diet without problems. Felt amendable for discharge home. Discharge medications and instructions reviewed with pt. Will obtained F/U CT scan in 7-10 days.   Consults: IR  Significant Diagnostic Studies: labs, microbiology,and IR  Treatments: IV hydration and antibiotics  Discharge Exam: Blood pressure 110/76, pulse 100, temperature 99.1 F (37.3 C), temperature source Oral, resp. rate 16, height 5\' 5"  (1.651 m), weight 72.6 kg, last menstrual period 11/01/2018, SpO2 100 %.  Lungs clear Heart RRR Abd soft + BS enlarged fibroid uterus, drainage cathter in placed Ext non tender  Disposition: Discharge disposition: 01-Home or Self Care       Discharge Instructions    Call MD for:  difficulty breathing, headache or visual disturbances   Complete by:  As directed    Call MD for:  extreme fatigue   Complete by:  As directed    Call MD for:  hives   Complete by:  As directed    Call MD for:  persistant dizziness or light-headedness   Complete by:  As directed    Call MD for:  persistant  nausea and vomiting   Complete by:  As directed    Call MD for:  redness, tenderness, or signs of infection (pain, swelling, redness, odor or green/yellow discharge around incision site)   Complete by:  As directed    Call MD for:  severe uncontrolled pain   Complete by:  As directed    Call MD for:  temperature >100.4   Complete by:  As directed    Diet Carb Modified   Complete by:  As directed    Increase activity slowly   Complete by:  As directed      Allergies as of 11/08/2018      Reactions   Penicillins Other (See Comments)   Do not remember symptoms but was told DO NOT TAKE.    Glipizide Hives      Medication List    TAKE these medications   acetaminophen 500 MG tablet Commonly known as:  TYLENOL Take 1,000 mg by mouth every 6 (six) hours as needed for mild pain or fever.   albuterol 108 (90 Base) MCG/ACT inhaler Commonly known as:  VENTOLIN HFA Inhale 2 puffs into the lungs every 6 (six) hours as needed for wheezing or shortness of breath.   cephALEXin 500 MG capsule Commonly known as:  KEFLEX Take 1 capsule (500 mg total) by mouth 3 (three) times daily for 10 days.   Cetirizine HCl 10 MG Tbdp Take 10 mg by mouth daily.   metFORMIN 500 MG tablet Commonly known as:  GLUCOPHAGE Take by mouth 2 (two) times daily with a meal.   metroNIDAZOLE 500 MG tablet Commonly known as:  FLAGYL Take 1  tablet (500 mg total) by mouth 2 (two) times daily for 10 days.   multivitamin with minerals tablet Take 1 tablet by mouth daily.      Follow-up Information    [facility] Follow up.   Why:  Pt will be called with follow up appt date and time Contact information: [address] [place] [address] [phone]          Signed: Chancy Milroy 11/08/2018, 9:23 AM

## 2018-11-08 NOTE — Discharge Instructions (Signed)
Peripheral Intravenous Catheter Placement, Adult, Care After  Refer to this sheet in the next few weeks. These instructions provide you with information about caring for yourself after your procedure. Your health care provider may also give you more specific instructions. Your treatment has been planned according to current medical practices, but problems sometimes occur. Call your health care provider if you have any problems or questions after your procedure. What can I expect after the procedure? After the procedure, it is common to have:  Soreness at the catheter site.  Bruising at the catheter site. Follow these instructions at home: General instructions  Take over-the-counter and prescription medicines only as told by your health care provider.  There are many different ways to close and cover the puncture site, including stitches, skin glue, and adhesive strips. Follow your health care providers instructions about: ? Puncture site care. ? Bandage (dressing) changes and removal. ? Puncture site closure removal.  Check the puncture site every day for signs of infection. Watch for: ? Redness, swelling, or pain. ? Fluid, blood, or pus. If You Go Home with Your Catheter In:  Follow instructions from your health care provider about bathing.  Do not get the catheter wet.  Do not pull on the catheter or tubing. This can make the catheter come out from the vein.  Keep all follow-up visits as told by your health care provider. This is important. Contact a health care provider if:  You develop a bruise around the puncture site.  You have redness, swelling, or pain at the puncture site.  You have fluid, blood, or pus coming from the puncture site.  You have a fever.  You went home with the catheter in and: ? You have pain, redness, swelling, or leakage where the catheter is located. ? The dressing or tape is loose. ? The catheter falls out. ? The catheter or tubing was  pulled. ? The catheter will not flush. ? The catheter stops working. Get help right away if:  You have very bad pain around the puncture site.  You have red streaks above or below the puncture site.  You lose feeling or develop weakness in the area of the puncture site. This information is not intended to replace advice given to you by your health care provider. Make sure you discuss any questions you have with your health care provider. Document Released: 08/04/2015 Document Revised: 12/14/2015 Document Reviewed: 01/20/2015 Elsevier Interactive Patient Education  2019 [employer].

## 2018-11-09 LAB — CULTURE, BLOOD (ROUTINE X 2)
Culture: NO GROWTH
Culture: NO GROWTH
Special Requests: ADEQUATE

## 2018-11-10 ENCOUNTER — Other Ambulatory Visit: Payer: Self-pay | Admitting: Obstetrics and Gynecology

## 2018-11-10 DIAGNOSIS — R188 Other ascites: Secondary | ICD-10-CM

## 2018-11-10 DIAGNOSIS — R1084 Generalized abdominal pain: Secondary | ICD-10-CM

## 2018-11-11 LAB — AEROBIC/ANAEROBIC CULTURE W GRAM STAIN (SURGICAL/DEEP WOUND)

## 2018-11-12 ENCOUNTER — Telehealth: Payer: Self-pay | Admitting: *Deleted

## 2018-11-12 NOTE — Telephone Encounter (Signed)
Spoke with [employer] regarding disability claim.  Information given as needed to process claim.

## 2018-11-18 ENCOUNTER — Ambulatory Visit (HOSPITAL_COMMUNITY)
Admission: RE | Admit: 2018-11-18 | Discharge: 2018-11-18 | Disposition: A | Discharge status: Home / Self Care | Payer: No Typology Code available for payment source | Source: Ambulatory Visit | Attending: Obstetrics and Gynecology | Admitting: Obstetrics and Gynecology

## 2018-11-18 ENCOUNTER — Other Ambulatory Visit: Payer: Self-pay

## 2018-11-18 DIAGNOSIS — R1084 Generalized abdominal pain: Secondary | ICD-10-CM | POA: Insufficient documentation

## 2018-11-18 DIAGNOSIS — R188 Other ascites: Secondary | ICD-10-CM | POA: Insufficient documentation

## 2018-11-18 MED ORDER — SODIUM CHLORIDE (PF) 0.9 % IJ SOLN
INTRAMUSCULAR | Status: AC
  Filled 2018-11-18: qty 50

## 2018-11-18 MED ORDER — IOHEXOL 300 MG/ML  SOLN
100.0000 mL | Freq: Once | INTRAMUSCULAR | Status: AC | PRN
  Administered 2018-11-18: 100 mL via INTRAVENOUS

## 2018-11-24 ENCOUNTER — Encounter: Payer: Self-pay | Admitting: Obstetrics and Gynecology

## 2018-11-24 ENCOUNTER — Ambulatory Visit (INDEPENDENT_AMBULATORY_CARE_PROVIDER_SITE_OTHER): Payer: No Typology Code available for payment source | Admitting: Obstetrics and Gynecology

## 2018-11-24 ENCOUNTER — Other Ambulatory Visit: Payer: Self-pay

## 2018-11-24 VITALS — BP 128/89 | HR 89 | Temp 98.3°F | Wt 152.4 lb

## 2018-11-24 DIAGNOSIS — R188 Other ascites: Secondary | ICD-10-CM

## 2018-11-24 NOTE — Progress Notes (Signed)
Tricia Benitez presents for hospital follow up from intra abdonimal abscess, s/p CT guided drain placement.  F/U CT scan last week abscess almost completely resolved.  She denies fever, N/V, chills or pain today. Has completed antibiotics. No bowel or bladder dysfunction.  PE AF VSS Lungs clear Heart RRR Abd soft + BS fibroid uterus non tender Drain removed without problems  A/P Intra-abdominal abscess, s/p drainage  Drain removed today. Return to work not provided. Will start OCP's with next cycle. U/R/B reviewed Samples of Lo Loestrin provided. F/U in 3 months or PRN

## 2018-12-10 ENCOUNTER — Encounter: Payer: Self-pay | Admitting: Nurse Practitioner

## 2019-01-25 ENCOUNTER — Ambulatory Visit: Payer: No Typology Code available for payment source | Admitting: Nurse Practitioner

## 2019-01-28 ENCOUNTER — Telehealth: Payer: Self-pay | Admitting: Nurse Practitioner

## 2019-01-28 NOTE — Telephone Encounter (Signed)

## 2019-01-29 ENCOUNTER — Ambulatory Visit (INDEPENDENT_AMBULATORY_CARE_PROVIDER_SITE_OTHER): Payer: PRIVATE HEALTH INSURANCE

## 2019-01-29 ENCOUNTER — Ambulatory Visit (INDEPENDENT_AMBULATORY_CARE_PROVIDER_SITE_OTHER): Payer: PRIVATE HEALTH INSURANCE | Admitting: Nurse Practitioner

## 2019-01-29 ENCOUNTER — Other Ambulatory Visit: Payer: Self-pay

## 2019-01-29 ENCOUNTER — Encounter: Payer: Self-pay | Admitting: Nurse Practitioner

## 2019-01-29 ENCOUNTER — Other Ambulatory Visit (INDEPENDENT_AMBULATORY_CARE_PROVIDER_SITE_OTHER): Payer: PRIVATE HEALTH INSURANCE

## 2019-01-29 VITALS — BP 106/82 | HR 90 | Temp 98.4°F | Ht 65.0 in | Wt 143.6 lb

## 2019-01-29 DIAGNOSIS — D500 Iron deficiency anemia secondary to blood loss (chronic): Secondary | ICD-10-CM

## 2019-01-29 DIAGNOSIS — E119 Type 2 diabetes mellitus without complications: Secondary | ICD-10-CM | POA: Diagnosis not present

## 2019-01-29 DIAGNOSIS — R5383 Other fatigue: Secondary | ICD-10-CM

## 2019-01-29 DIAGNOSIS — R634 Abnormal weight loss: Secondary | ICD-10-CM | POA: Diagnosis not present

## 2019-01-29 DIAGNOSIS — E559 Vitamin D deficiency, unspecified: Secondary | ICD-10-CM

## 2019-01-29 DIAGNOSIS — E1165 Type 2 diabetes mellitus with hyperglycemia: Secondary | ICD-10-CM

## 2019-01-29 DIAGNOSIS — R5381 Other malaise: Secondary | ICD-10-CM | POA: Diagnosis not present

## 2019-01-29 DIAGNOSIS — F4323 Adjustment disorder with mixed anxiety and depressed mood: Secondary | ICD-10-CM

## 2019-01-29 LAB — CBC WITH DIFFERENTIAL/PLATELET
Basophils Absolute: 0 10*3/uL (ref 0.0–0.1)
Basophils Relative: 0.8 % (ref 0.0–3.0)
Eosinophils Absolute: 0.1 10*3/uL (ref 0.0–0.7)
Eosinophils Relative: 1.4 % (ref 0.0–5.0)
HCT: 39.6 % (ref 36.0–46.0)
Hemoglobin: 12.7 g/dL (ref 12.0–15.0)
Lymphocytes Relative: 41.2 % (ref 12.0–46.0)
Lymphs Abs: 1.8 10*3/uL (ref 0.7–4.0)
MCHC: 32.2 g/dL (ref 30.0–36.0)
MCV: 73.6 fl — ABNORMAL LOW (ref 78.0–100.0)
Monocytes Absolute: 0.2 10*3/uL (ref 0.1–1.0)
Monocytes Relative: 5.5 % (ref 3.0–12.0)
Neutro Abs: 2.3 10*3/uL (ref 1.4–7.7)
Neutrophils Relative %: 51.1 % (ref 43.0–77.0)
Platelets: 310 10*3/uL (ref 150.0–400.0)
RBC: 5.38 Mil/uL — ABNORMAL HIGH (ref 3.87–5.11)
RDW: 16.7 % — ABNORMAL HIGH (ref 11.5–15.5)
WBC: 4.5 10*3/uL (ref 4.0–10.5)

## 2019-01-29 LAB — BASIC METABOLIC PANEL
BUN: 11 mg/dL (ref 6–23)
CO2: 24 mEq/L (ref 19–32)
Calcium: 9.2 mg/dL (ref 8.4–10.5)
Chloride: 103 mEq/L (ref 96–112)
Creatinine, Ser: 0.59 mg/dL (ref 0.40–1.20)
GFR: 143.14 mL/min (ref >60.00)
Glucose, Bld: 153 mg/dL — ABNORMAL HIGH (ref 70–99)
Potassium: 4 mEq/L (ref 3.5–5.1)
Sodium: 136 mEq/L (ref 135–145)

## 2019-01-29 LAB — TSH: TSH: 1.03 u[IU]/mL (ref 0.35–4.50)

## 2019-01-29 LAB — HEMOGLOBIN A1C: Hgb A1c MFr Bld: 7.2 % — ABNORMAL HIGH (ref 4.6–6.5)

## 2019-01-29 MED ORDER — METFORMIN HCL 500 MG PO TABS: 500.0000 mg | ORAL_TABLET | Freq: Two times a day (BID) | ORAL | 5 refills | Status: DC

## 2019-01-29 NOTE — Patient Instructions (Addendum)
Patient notified about lab results.  She declined any antianxiety medication or antidepressant at this time. She will ike to schedule counseling sessions through [facility] at this time. She will call office to schedule 21month F/up.

## 2019-01-29 NOTE — Progress Notes (Signed)
Subjective:  Patient ID: Tricia Benitez, female    DOB: 11/20/1985  Age: 33 y.o. MRN: [id number]  CC: Follow-up (follow up on DM--fasting/pt is c/o dizzieness and weak--tired--unable to sleep--stress out/going for wks/)  HPI  DM: Home glucose: 100-120 (fasting). No adverse effects with metformin. No neuropathy, no change in vision.  Worsening fatigue, dizziness, and insomnia since discharge from hospital. Onset 63month ago. Depression screen Tricia Benitez 2/9 01/29/2019 01/29/2019 04/22/2018  Decreased Interest 1 3 0  Down, Depressed, Hopeless 1 3 0  PHQ - 2 Score 2 6 0  Altered sleeping 2 - -  Tired, decreased energy 3 - -  Change in appetite 0 - -  Feeling bad or failure about yourself  0 - -  Trouble concentrating 0 - -  Moving slowly or fidgety/restless 0 - -  Suicidal thoughts 0 - -  PHQ-9 Score 7 - -   GAD 7 : Generalized Anxiety Score 01/29/2019  Nervous, Anxious, on Edge 1  Control/stop worrying 1  Worry too much - different things 0  Trouble relaxing 1  Restless 0  Easily annoyed or irritable 0  Afraid - awful might happen 3  Total GAD 7 Score 6   Reviewed past Medical, Social and Family history today.  Outpatient Medications Prior to Visit  Medication Sig Dispense Refill  . albuterol (VENTOLIN HFA) 108 (90 Base) MCG/ACT inhaler Inhale 2 puffs into the lungs every 6 (six) hours as needed for wheezing or shortness of breath. 1 Inhaler 2  . Cetirizine HCl 10 MG TBDP Take 10 mg by mouth daily.    . Multiple Vitamins-Minerals (MULTIVITAMIN WITH MINERALS) tablet Take 1 tablet by mouth daily.    Marland Kitchen acetaminophen (TYLENOL) 500 MG tablet Take 1,000 mg by mouth every 6 (six) hours as needed for mild pain or fever.    . metFORMIN (GLUCOPHAGE) 500 MG tablet Take by mouth 2 (two) times daily with a meal.     No facility-administered medications prior to visit.     ROS See HPI  Objective:  BP 106/82   Pulse 90   Temp 98.4 F (36.9 C) (Oral)   Ht 5\' 5"  (1.651 m)   Wt 143 lb  9.6 oz (65.1 kg)   SpO2 100%   BMI 23.90 kg/m   BP Readings from Last 3 Encounters:  01/29/19 106/82  11/24/18 128/89  11/08/18 110/76    Wt Readings from Last 3 Encounters:  01/29/19 143 lb 9.6 oz (65.1 kg)  11/24/18 152 lb 6.4 oz (69.1 kg)  11/04/18 160 lb (72.6 kg)    Physical Exam Neurological:     Mental Status: Tricia Benitez is alert.  Psychiatric:        Attention and Perception: Attention normal.        Mood and Affect: Mood is depressed. Affect is tearful.        Speech: Speech normal.        Behavior: Behavior is cooperative.        Thought Content: Thought content does not include homicidal or suicidal ideation. Thought content does not include homicidal or suicidal plan.        Cognition and Memory: Cognition normal.        Judgment: Judgment normal.     Lab Results  Component Value Date   WBC 4.5 01/29/2019   HGB 12.7 01/29/2019   HCT 39.6 01/29/2019   PLT 310.0 01/29/2019   GLUCOSE 153 (H) 01/29/2019   CHOL 130 07/24/2018   TRIG 67.0 07/24/2018  HDL 39.20 07/24/2018   LDLCALC 78 07/24/2018   ALT 11 11/04/2018   AST 12 (L) 11/04/2018   NA 136 01/29/2019   K 4.0 01/29/2019   CL 103 01/29/2019   CREATININE 0.59 01/29/2019   BUN 11 01/29/2019   CO2 24 01/29/2019   TSH 1.03 01/29/2019   INR 1.2 11/06/2018   HGBA1C 7.2 (H) 01/29/2019   MICROALBUR <0.7 04/22/2018    Ct Abdomen Pelvis W Contrast  Result Date: 11/18/2018 CLINICAL DATA:  33 year old female with abscess EXAM: CT ABDOMEN AND PELVIS WITH CONTRAST TECHNIQUE: Multidetector CT imaging of the abdomen and pelvis was performed using the standard protocol following bolus administration of intravenous contrast. CONTRAST:  164mL OMNIPAQUE IOHEXOL 300 MG/ML  SOLN COMPARISON:  Multiple prior, most remote 04/23/2018, most recent 11/04/2018 FINDINGS: Lower chest: Soft tissue nodule within the left breast previously measured 2.5 cm. Current measurement 2.9 cm. No acute finding of the lower chest. Hepatobiliary:  Unremarkable liver.  Unremarkable gallbladder Pancreas: Unremarkable Spleen: Unremarkable Adrenals/Urinary Tract: Unremarkable appearance of the adrenal glands. No evidence of hydronephrosis of the right or left kidney. No nephrolithiasis. Unremarkable course of the bilateral ureters. Unremarkable appearance of the urinary bladder. Stomach/Bowel: Unremarkable stomach. Unremarkable small bowel. Moderate to large stool burden. No obstruction. No inflammatory changes of the colon. Vascular/Lymphatic: No atherosclerotic changes. Unremarkable aorta, bilateral iliac arteries, proximal femoral arteries. Small lymph nodes in the periaortic nodal station, with likely reactive nodes of the bilateral iliac nodal station Reproductive: Surgical changes of myomectomy again noted. Trace fluid within the endometrial canal. Decreasing size of rim enhancing fluid collection within the central uterus, presumably surgical cavity, previously 4.2 cm, currently 2.8 cm. Ill-defined linear hypodensity in the right aspect of the uterine fundus measures 12 mm, decreased from 14 mm. Unchanged position of anterior approach percutaneous drainage catheter, terminating anterior to the uterine fundus and left adnexa. There is near complete resolution of the fluid collection, with scant residual fluid and inflammation adjacent to the surgical drain. No new fluid collection. Other: Surgical changes of midline abdomen. Musculoskeletal: No acute displaced fracture.  Degenerative changes. IMPRESSION: Unchanged position of anterior abdominal drainage catheter, with near complete resolution of the abscess anterior to the uterine fundus/left adnexa. Scant residual fluid/inflammation. Decreasing size of rim enhancing collection in the central fibroid uterus, presumably surgical cavity/defect, previously 4.2 cm, currently 2.8 cm. Decreasing size of linear hypodensity in the right uterine fundus, presumably postsurgical. Electronically Signed   By: Corrie Mckusick D.O.   On: 11/18/2018 15:45    Assessment & Plan:   Tricia Benitez was seen today for follow-up.  Diagnoses and all orders for this visit:  Type 2 diabetes mellitus with hyperglycemia, without long-term current use of insulin (HCC) -     Cancel: Basic metabolic panel -     Cancel: Hemoglobin A1c -     CBC w/Diff; Future -     Cancel: Hemoglobin A1c; Future -     Hemoglobin A1c; Future -     Hemoglobin A1c -     metFORMIN (GLUCOPHAGE) 500 MG tablet; Take 1 tablet (500 mg total) by mouth 2 (two) times daily with a meal.  Vitamin D deficiency -     Vitamin D 1,25 dihydroxy -     Cancel: Vitamin D 1,25 dihydroxy; Future  Weight loss -     Urinalysis w microscopic + reflex cultur -     DG Chest 2 View -     CBC w/Diff; Future -  Basic metabolic panel; Future -     TSH; Future  Adjustment disorder with mixed anxiety and depressed mood -     Cancel: TSH  Iron deficiency anemia due to chronic blood loss -     Cancel: CBC w/Diff -     Cancel: Iron, TIBC and Ferritin Panel -     Iron, TIBC and Ferritin Panel; Future  Malaise and fatigue -     DG Chest 2 View   I have changed Jari Swander's metFORMIN. I am also having Tricia Benitez maintain Tricia Benitez multivitamin with minerals, acetaminophen, Cetirizine HCl, and albuterol.  Meds ordered this encounter  Medications  . metFORMIN (GLUCOPHAGE) 500 MG tablet    Sig: Take 1 tablet (500 mg total) by mouth 2 (two) times daily with a meal.    Dispense:  60 tablet    Refill:  5    Order Specific Question:   Supervising Provider    Answer:   Tricia Benitez, Tricia Benitez [[id number]]    Problem List Items Addressed This Visit      Other   Vitamin D deficiency   Relevant Orders   Vitamin D 1,25 dihydroxy    Other Visit Diagnoses    Type 2 diabetes mellitus with hyperglycemia, without long-term current use of insulin ([place])    -  Primary   Relevant Medications   metFORMIN (GLUCOPHAGE) 500 MG tablet   Other Relevant Orders   CBC w/Diff (Completed)    Hemoglobin A1c (Completed)   Weight loss       Relevant Orders   Urinalysis w microscopic + reflex cultur   DG Chest 2 View (Completed)   CBC w/Diff (Completed)   Basic metabolic panel (Completed)   TSH (Completed)   Adjustment disorder with mixed anxiety and depressed mood       Iron deficiency anemia due to chronic blood loss       Relevant Orders   Iron, TIBC and Ferritin Panel   Malaise and fatigue       Relevant Orders   DG Chest 2 View (Completed)       Follow-up: Return in about 4 weeks (around 02/26/2019) for anxiety and depression.  Wilfred Lacy, NP

## 2019-02-03 LAB — URINALYSIS W MICROSCOPIC + REFLEX CULTURE
Bacteria, UA: NONE SEEN /HPF
Bilirubin Urine: NEGATIVE
Glucose, UA: NEGATIVE
Hgb urine dipstick: NEGATIVE
Hyaline Cast: NONE SEEN /LPF
Ketones, ur: NEGATIVE
Leukocyte Esterase: NEGATIVE
Nitrites, Initial: NEGATIVE
Protein, ur: NEGATIVE
RBC / HPF: NONE SEEN /HPF (ref 0–2)
Specific Gravity, Urine: 1.01 (ref 1.001–1.03)
Squamous Epithelial / HPF: NONE SEEN /HPF (ref <=5)
WBC, UA: NONE SEEN /HPF (ref 0–5)
pH: <=5 (ref 5.0–8.0)

## 2019-02-03 LAB — VITAMIN D 1,25 DIHYDROXY

## 2019-02-03 LAB — TSH: TSH: 1.14 mIU/L

## 2019-02-03 LAB — NO CULTURE INDICATED

## 2019-02-03 LAB — IRON,TIBC AND FERRITIN PANEL

## 2019-02-03 LAB — CBC WITH DIFFERENTIAL/PLATELET

## 2019-02-03 LAB — HEMOGLOBIN A1C

## 2019-02-04 LAB — VITAMIN D 1,25 DIHYDROXY
Vitamin D 1, 25 (OH)2 Total: 42 pg/mL (ref 18–72)
Vitamin D2 1, 25 (OH)2: <8 pg/mL
Vitamin D3 1, 25 (OH)2: 42 pg/mL

## 2019-02-04 LAB — IRON,TIBC AND FERRITIN PANEL
%SAT: 11 % (calc) — ABNORMAL LOW (ref 16–45)
Ferritin: 18 ng/mL (ref 16–154)
Iron: 37 ug/dL — ABNORMAL LOW (ref 40–190)
TIBC: 343 mcg/dL (calc) (ref 250–450)

## 2019-02-26 ENCOUNTER — Ambulatory Visit
Admission: RE | Admit: 2019-02-26 | Discharge: 2019-02-26 | Disposition: A | Discharge status: Home / Self Care | Payer: PRIVATE HEALTH INSURANCE | Source: Ambulatory Visit | Attending: Nurse Practitioner | Admitting: Nurse Practitioner

## 2019-02-26 ENCOUNTER — Other Ambulatory Visit: Payer: Self-pay

## 2019-02-26 DIAGNOSIS — D241 Benign neoplasm of right breast: Secondary | ICD-10-CM

## 2019-04-14 LAB — HM DIABETES EYE EXAM

## 2019-04-16 ENCOUNTER — Ambulatory Visit (HOSPITAL_COMMUNITY)
Admission: EM | Admit: 2019-04-16 | Discharge: 2019-04-16 | Disposition: A | Discharge status: Home / Self Care | Payer: No Typology Code available for payment source | Attending: Emergency Medicine | Admitting: Emergency Medicine

## 2019-04-16 ENCOUNTER — Other Ambulatory Visit: Payer: Self-pay

## 2019-04-16 ENCOUNTER — Encounter: Payer: Self-pay | Admitting: Nurse Practitioner

## 2019-04-16 ENCOUNTER — Ambulatory Visit (INDEPENDENT_AMBULATORY_CARE_PROVIDER_SITE_OTHER): Payer: PRIVATE HEALTH INSURANCE | Admitting: Nurse Practitioner

## 2019-04-16 ENCOUNTER — Ambulatory Visit (INDEPENDENT_AMBULATORY_CARE_PROVIDER_SITE_OTHER): Payer: No Typology Code available for payment source

## 2019-04-16 ENCOUNTER — Encounter (HOSPITAL_COMMUNITY): Payer: Self-pay

## 2019-04-16 VITALS — BP 134/96 | HR 82 | Temp 98.1°F | Ht 65.0 in | Wt 147.4 lb

## 2019-04-16 DIAGNOSIS — E1165 Type 2 diabetes mellitus with hyperglycemia: Secondary | ICD-10-CM

## 2019-04-16 DIAGNOSIS — R079 Chest pain, unspecified: Secondary | ICD-10-CM | POA: Insufficient documentation

## 2019-04-16 DIAGNOSIS — R42 Dizziness and giddiness: Secondary | ICD-10-CM

## 2019-04-16 DIAGNOSIS — F4323 Adjustment disorder with mixed anxiety and depressed mood: Secondary | ICD-10-CM | POA: Diagnosis not present

## 2019-04-16 LAB — BASIC METABOLIC PANEL
Anion gap: 10 (ref 5–15)
BUN: 11 mg/dL (ref 6–20)
CO2: 24 mmol/L (ref 22–32)
Calcium: 9.3 mg/dL (ref 8.9–10.3)
Chloride: 102 mmol/L (ref 98–111)
Creatinine, Ser: 0.62 mg/dL (ref 0.44–1.00)
GFR calc Af Amer: >60 mL/min (ref >60)
GFR calc non Af Amer: >60 mL/min (ref >60)
Glucose, Bld: 191 mg/dL — ABNORMAL HIGH (ref 70–99)
Potassium: 4.2 mmol/L (ref 3.5–5.1)
Sodium: 136 mmol/L (ref 135–145)

## 2019-04-16 LAB — CBC
HCT: 42.1 % (ref 36.0–46.0)
Hemoglobin: 13.2 g/dL (ref 12.0–15.0)
MCH: 24.1 pg — ABNORMAL LOW (ref 26.0–34.0)
MCHC: 31.4 g/dL (ref 30.0–36.0)
MCV: 77 fL — ABNORMAL LOW (ref 80.0–100.0)
Platelets: 335 10*3/uL (ref 150–400)
RBC: 5.47 MIL/uL — ABNORMAL HIGH (ref 3.87–5.11)
RDW: 14.2 % (ref 11.5–15.5)
WBC: 4.9 10*3/uL (ref 4.0–10.5)
nRBC: 0 % (ref 0.0–0.2)

## 2019-04-16 LAB — MICROALBUMIN / CREATININE URINE RATIO
Creatinine,U: 120.5 mg/dL
Microalb Creat Ratio: 0.8 mg/g (ref 0.0–30.0)
Microalb, Ur: 0.9 mg/dL (ref 0.0–1.9)

## 2019-04-16 MED ORDER — OMEPRAZOLE 20 MG PO CPDR: 20.0000 mg | DELAYED_RELEASE_CAPSULE | Freq: Every day | ORAL | 0 refills | Status: DC

## 2019-04-16 NOTE — ED Triage Notes (Signed)
Patient report she is having intermittent chest pain for 5 days and she started feeling dizzy today.

## 2019-04-16 NOTE — Progress Notes (Signed)
Subjective:  Patient ID: Tricia Benitez, female    DOB: [birth year]  Age: 33 y.o. MRN: [id number]  CC: Follow-up (pt c/o of chest discomfort going on for a couple months but just got bad in last couple days/blood work and urine? no flu shot)   HPI Anxiety: Reports persistent anxious feelings and panic attacks in relation to her health. She experiences palpitations, chest tightness and disinterest in daily activities. Despite acknowledgement of anxiety, she does not want to take ant medication. She is concerned about potential side effects. She admits to not taking iron supplement or metformin. She has schedule appt with therapist for next week.  GAD 7 : Generalized Anxiety Score 04/16/2019 01/29/2019  Nervous, Anxious, on Edge 3 1  Control/stop worrying 2 1  Worry too much - different things 2 0  Trouble relaxing 2 1  Restless 0 0  Easily annoyed or irritable 2 0  Afraid - awful might happen 3 3  Total GAD 7 Score 14 6   Depression screen [facility] 2/9 04/16/2019 01/29/2019 01/29/2019  Decreased Interest 2 1 3   Down, Depressed, Hopeless 0 1 3  PHQ - 2 Score 2 2 6   Altered sleeping 2 2 -  Tired, decreased energy 2 3 -  Change in appetite 2 0 -  Feeling bad or failure about yourself  0 0 -  Trouble concentrating 2 0 -  Moving slowly or fidgety/restless 2 0 -  Suicidal thoughts 0 0 -  PHQ-9 Score 12 7 -   Reviewed past Medical, Social and Family history today.  Outpatient Medications Prior to Visit  Medication Sig Dispense Refill  . albuterol (VENTOLIN HFA) 108 (90 Base) MCG/ACT inhaler Inhale 2 puffs into the lungs every 6 (six) hours as needed for wheezing or shortness of breath. 1 Inhaler 2  . Ferrous Sulfate (IRON PO) Take by mouth.    Marland Kitchen acetaminophen (TYLENOL) 500 MG tablet Take 1,000 mg by mouth every 6 (six) hours as needed for mild pain or fever.    . Cetirizine HCl 10 MG TBDP Take 10 mg by mouth daily.    . metFORMIN (GLUCOPHAGE) 500 MG tablet Take 1 tablet (500 mg total) by  mouth 2 (two) times daily with a meal. (Patient not taking: Reported on 04/16/2019) 60 tablet 5  . Multiple Vitamins-Minerals (MULTIVITAMIN WITH MINERALS) tablet Take 1 tablet by mouth daily.     No facility-administered medications prior to visit.     ROS See HPI  Objective:  BP (!) 134/96   Pulse 82   Temp 98.1 F (36.7 C) (Tympanic)   Ht 5\' 5"  (1.651 m)   Wt 147 lb 6.4 oz (66.9 kg)   LMP 04/11/2019   SpO2 100%   BMI 24.53 kg/m   BP Readings from Last 3 Encounters:  04/16/19 139/89  04/16/19 (!) 134/96  01/29/19 106/82    Wt Readings from Last 3 Encounters:  04/16/19 147 lb 6.4 oz (66.9 kg)  01/29/19 143 lb 9.6 oz (65.1 kg)  11/24/18 152 lb 6.4 oz (69.1 kg)   Physical Exam Vitals signs reviewed.  Cardiovascular:     Rate and Rhythm: Normal rate and regular rhythm.     Pulses: Normal pulses.     Heart sounds: Normal heart sounds.  Pulmonary:     Effort: Pulmonary effort is normal.     Breath sounds: Normal breath sounds.  Abdominal:     General: Bowel sounds are normal.     Palpations: Abdomen is soft.  Neurological:  Mental Status: She is alert and oriented to person, place, and time.  Psychiatric:        Attention and Perception: Attention normal.        Mood and Affect: Mood is anxious. Affect is tearful.        Speech: Speech normal.        Behavior: Behavior is cooperative.        Thought Content: Thought content normal. Thought content does not include homicidal or suicidal ideation. Thought content does not include homicidal or suicidal plan.        Cognition and Memory: Cognition normal.        Judgment: Judgment is inappropriate.     Lab Results  Component Value Date   WBC 4.9 04/16/2019   HGB 13.2 04/16/2019   HCT 42.1 04/16/2019   PLT 335 04/16/2019   GLUCOSE 191 (H) 04/16/2019   CHOL 130 07/24/2018   TRIG 67.0 07/24/2018   HDL 39.20 07/24/2018   LDLCALC 78 07/24/2018   ALT 11 11/04/2018   AST 12 (L) 11/04/2018   NA 136 04/16/2019    K 4.2 04/16/2019   CL 102 04/16/2019   CREATININE 0.62 04/16/2019   BUN 11 04/16/2019   CO2 24 04/16/2019   TSH 1.03 01/29/2019   INR 1.2 11/06/2018   HGBA1C 7.2 (H) 01/29/2019   MICROALBUR 0.9 04/16/2019    Assessment & Plan:   Tricia Benitez was seen today for follow-up.  Diagnoses and all orders for this visit:  Adjustment disorder with mixed anxiety and depressed mood  Type 2 diabetes mellitus with hyperglycemia, without long-term current use of insulin (HCC) -     Microalbumin / creatinine urine ratio   I am having Tricia Benitez maintain her multivitamin with minerals, acetaminophen, Cetirizine HCl, albuterol, metFORMIN, and Ferrous Sulfate (IRON PO).  No orders of the defined types were placed in this encounter.   Problem List Items Addressed This Visit    None    Visit Diagnoses    Adjustment disorder with mixed anxiety and depressed mood    -  Primary   Type 2 diabetes mellitus with hyperglycemia, without long-term current use of insulin ([place])       Relevant Orders   Microalbumin / creatinine urine ratio (Completed)      Follow-up: Return in about 3 months (around 07/16/2019) for DM and anxiety.  Wilfred Lacy, NP

## 2019-04-16 NOTE — ED Provider Notes (Signed)
[place]    CSN: [id number] Arrival date & time: 04/16/19  1517      History   Chief Complaint Chief Complaint  Patient presents with   Chest Pain   Dizziness    HPI Tricia Benitez is a 33 y.o. female.   Tricia Benitez presents with complaints of sharp stabbing central and left lateral chest pain. Started 9/20 and has been intermittent since, lasting approximately 2-3 minutes at a time. Last episode at 0530 this morning. Sometimes feels light headed. Was at work today before arrival and felt light headed which prompted her to come to be seen. No cough or URI symptoms. Sometimes she feels nauseated and sometimes with heartburn. Has been able to eat and drink, this has not worsened her symptoms. Laying on her left side does seem to worsen it. Has had minor similar episodes which have resolved and not recurred this frequently. No recent travel, no oral birth control or hormone therapy. Mild left calf soreness. No shortness of breath, no cough. Doesn't smoke. No immobilization. No personal or family cardiac history. Saw her PCP today who discussed anxiety and therapy was recommended. Hasn't tried any medications for symptoms. Has been anemic in the past with mildly low iron. History  Of asthma and DM as well.     ROS per HPI, negative if not otherwise mentioned.      Past Medical History:  Diagnosis Date   Anemia    Asthma    Diabetes mellitus without complication ([place])    Type II   UTI (urinary tract infection)     Patient Active Problem List   Diagnosis Date Noted   Intra-abdominal fluid collection 11/04/2018   Vitamin D deficiency 05/04/2018   Left breast mass 04/23/2018    Past Surgical History:  Procedure Laterality Date   BREAST BIOPSY Left 2015   cyst removed.    MOUTH SURGERY     teeth ext    MYOMECTOMY N/A 09/01/2018   Procedure: ABDOMINAL MYOMECTOMY;  Surgeon: Chancy Milroy, MD;  Location: [facility];  Service: Gynecology;   Laterality: N/A;   TONSILLECTOMY      OB History    Gravida  1   Para      Term      Preterm      AB  1   Living        SAB      TAB  1   Ectopic      Multiple      Live Births               Home Medications    Prior to Admission medications   Medication Sig Start Date End Date Taking? Authorizing Provider  acetaminophen (TYLENOL) 500 MG tablet Take 1,000 mg by mouth every 6 (six) hours as needed for mild pain or fever.    [provider]  albuterol (VENTOLIN HFA) 108 (90 Base) MCG/ACT inhaler Inhale 2 puffs into the lungs every 6 (six) hours as needed for wheezing or shortness of breath. 11/08/18   Chancy Milroy, MD  Cetirizine HCl 10 MG TBDP Take 10 mg by mouth daily.    [provider]  Ferrous Sulfate (IRON PO) Take by mouth.    [provider]  metFORMIN (GLUCOPHAGE) 500 MG tablet Take 1 tablet (500 mg total) by mouth 2 (two) times daily with a meal. Patient not taking: Reported on 04/16/2019 01/29/19   Nche, Charlene Brooke, NP  Multiple Vitamins-Minerals (MULTIVITAMIN  WITH MINERALS) tablet Take 1 tablet by mouth daily.    [provider]  omeprazole (PRILOSEC) 20 MG capsule Take 1 capsule (20 mg total) by mouth daily. 04/16/19   Zigmund Gottron, NP    Family History Family History  Problem Relation Age of Onset   Fibroids Mother    Asthma Mother    Diabetes Father    Kidney disease Father    Alcohol abuse Father    Diabetes Paternal Grandmother    Hyperlipidemia Paternal Grandmother    Hypertension Paternal Grandmother    Alcohol abuse Maternal Grandmother     Social History Social History   Tobacco Use   Smoking status: Never Smoker   Smokeless tobacco: Never Used  Substance Use Topics   Alcohol use: Yes    Comment: social   Drug use: Never     Allergies   Penicillins and Glipizide   Review of Systems Review of Systems   Physical Exam Triage Vital Signs ED Triage Vitals  Enc  Vitals Group     BP 04/16/19 1530 139/89     Pulse Rate 04/16/19 1530 80     Resp 04/16/19 1530 15     Temp 04/16/19 1530 98.4 F (36.9 C)     Temp Source 04/16/19 1530 Temporal     SpO2 04/16/19 1530 100 %     Weight --      Height --      Head Circumference --      Peak Flow --      Pain Score 04/16/19 1528 6     Pain Loc --      Pain Edu? --      Excl. in [place]? --    No data found.  Updated Vital Signs BP 139/89 (BP Location: Left Arm)    Pulse 80    Temp 98.4 F (36.9 C) (Temporal)    Resp 15    LMP 04/11/2019    SpO2 100%    Physical Exam Constitutional:      General: She is not in acute distress.    Appearance: She is well-developed.  Cardiovascular:     Rate and Rhythm: Normal rate and regular rhythm.     Heart sounds: Normal heart sounds.  Pulmonary:     Effort: Pulmonary effort is normal.     Breath sounds: Normal breath sounds.  Chest:     Chest wall: No tenderness.  Musculoskeletal:     Comments: Calves soft non tender no swelling or warmth; negative homans  Skin:    General: Skin is warm and dry.  Neurological:     Mental Status: She is alert and oriented to person, place, and time.    EKG:  NSR rate of 80 . Previous EKG was available for review. No stwave changes as interpreted by me.    UC Treatments / Results  Labs (all labs ordered are listed, but only abnormal results are displayed) Labs Reviewed  CBC  BASIC METABOLIC PANEL    EKG   Radiology Dg Chest 2 View  Result Date: 04/16/2019 CLINICAL DATA:  Chest pain, dizziness EXAM: CHEST - 2 VIEW COMPARISON:  01/29/2019 FINDINGS: The heart size and mediastinal contours are within normal limits. Both lungs are clear. The visualized skeletal structures are unremarkable. IMPRESSION: No acute abnormality of the lungs. Electronically Signed   By: Eddie Candle M.D.   On: 04/16/2019 16:19    Procedures Procedures (including critical care time)  Medications Ordered in UC Medications -  No data to  display  Initial Impression / Assessment and Plan / UC Course  I have reviewed the triage vital signs and the nursing notes.  Pertinent labs & imaging results that were available during my care of the patient were reviewed by me and considered in my medical decision making (see chart for details).     Chest xray and ekg without acute findings. Without cardiac or PE risk. Vitals stable. Cbc and bmp recheck here today, pending. Will start omeprazole and see if this helps. As discussed with pcp anxiety certainly can also contribute. Continue to work with PCP if persistent. Return precautions provided. Patient verbalized understanding and agreeable to plan.  Ambulatory out of clinic without difficulty.    Final Clinical Impressions(s) / UC Diagnoses   Final diagnoses:  Chest pain, unspecified type  Lightheadedness     Discharge Instructions     Your ekg and chest xray look well today.  We will check to make sure no major anemia as well as check your electrolytes.  I will call you with any concerning results. You may monitor your results on your MyChart online as well.   Please start daily omeprazole.  Drink plenty of fluids.  Continue to work with your PCP for management if your symptoms persist. Any worsening of symptoms please go to the ER.    ED Prescriptions    Medication Sig Dispense Auth. Provider   omeprazole (PRILOSEC) 20 MG capsule Take 1 capsule (20 mg total) by mouth daily. 30 capsule Zigmund Gottron, NP     PDMP not reviewed this encounter.   Zigmund Gottron, NP 04/16/19 1708

## 2019-04-16 NOTE — Discharge Instructions (Signed)
Your ekg and chest xray look well today.  We will check to make sure no major anemia as well as check your electrolytes.  I will call you with any concerning results. You may monitor your results on your MyChart online as well.   Please start daily omeprazole.  Drink plenty of fluids.  Continue to work with your PCP for management if your symptoms persist. Any worsening of symptoms please go to the ER.

## 2019-04-16 NOTE — Patient Instructions (Addendum)
Go to lab for urine collection.  I strongly encourage you to take metformin and iron supplement as prescribed  Maintain appt with therapist. Let me know if you change your mind about taking buspar.  Living With Anxiety  After being diagnosed with an anxiety disorder, you may be relieved to know why you have felt or behaved a certain way. It is natural to also feel overwhelmed about the treatment ahead and what it will mean for your life. With care and support, you can manage this condition and recover from it. How to cope with anxiety Dealing with stress Stress is your body's reaction to life changes and events, both good and bad. Stress can last just a few hours or it can be ongoing. Stress can play a major role in anxiety, so it is important to learn both how to cope with stress and how to think about it differently. Talk with your health care provider or a counselor to learn more about stress reduction. He or she may suggest some stress reduction techniques, such as:  Music therapy. This can include creating or listening to music that you enjoy and that inspires you.  Mindfulness-based meditation. This involves being aware of your normal breaths, rather than trying to control your breathing. It can be done while sitting or walking.  Centering prayer. This is a kind of meditation that involves focusing on a word, phrase, or sacred image that is meaningful to you and that brings you peace.  Deep breathing. To do this, expand your stomach and inhale slowly through your nose. Hold your breath for 3-5 seconds. Then exhale slowly, allowing your stomach muscles to relax.  Self-talk. This is a skill where you identify thought patterns that lead to anxiety reactions and correct those thoughts.  Muscle relaxation. This involves tensing muscles then relaxing them. Choose a stress reduction technique that fits your lifestyle and personality. Stress reduction techniques take time and practice. Set  aside 5-15 minutes a day to do them. Therapists can offer training in these techniques. The training may be covered by some insurance plans. Other things you can do to manage stress include:  Keeping a stress diary. This can help you learn what triggers your stress and ways to control your response.  Thinking about how you respond to certain situations. You may not be able to control everything, but you can control your reaction.  Making time for activities that help you relax, and not feeling guilty about spending your time in this way. Therapy combined with coping and stress-reduction skills provides the best chance for successful treatment. Medicines Medicines can help ease symptoms. Medicines for anxiety include:  Anti-anxiety drugs.  Antidepressants.  Beta-blockers. Medicines may be used as the main treatment for anxiety disorder, along with therapy, or if other treatments are not working. Medicines should be prescribed by a health care provider. Relationships Relationships can play a big part in helping you recover. Try to spend more time connecting with trusted friends and family members. Consider going to couples counseling, taking family education classes, or going to family therapy. Therapy can help you and others better understand the condition. How to recognize changes in your condition Everyone has a different response to treatment for anxiety. Recovery from anxiety happens when symptoms decrease and stop interfering with your daily activities at home or work. This may mean that you will start to:  Have better concentration and focus.  Sleep better.  Be less irritable.  Have more energy.  Have improved  memory. It is important to recognize when your condition is getting worse. Contact your health care provider if your symptoms interfere with home or work and you do not feel like your condition is improving. Where to find help and support: You can get help and support from  these sources:  Self-help groups.  Online and [employer].  A trusted spiritual leader.  Couples counseling.  Family education classes.  Family therapy. Follow these instructions at home:  Eat a healthy diet that includes plenty of vegetables, fruits, whole grains, low-fat dairy products, and lean protein. Do not eat a lot of foods that are high in solid fats, added sugars, or salt.  Exercise. Most adults should do the following: ? Exercise for at least 150 minutes each week. The exercise should increase your heart rate and make you sweat (moderate-intensity exercise). ? Strengthening exercises at least twice a week.  Cut down on caffeine, tobacco, alcohol, and other potentially harmful substances.  Get the right amount and quality of sleep. Most adults need 7-9 hours of sleep each night.  Make choices that simplify your life.  Take over-the-counter and prescription medicines only as told by your health care provider.  Avoid caffeine, alcohol, and certain over-the-counter cold medicines. These may make you feel worse. Ask your pharmacist which medicines to avoid.  Keep all follow-up visits as told by your health care provider. This is important. Questions to ask your health care provider  Would I benefit from therapy?  How often should I follow up with a health care provider?  How long do I need to take medicine?  Are there any long-term side effects of my medicine?  Are there any alternatives to taking medicine? Contact a health care provider if:  You have a hard time staying focused or finishing daily tasks.  You spend many hours a day feeling worried about everyday life.  You become exhausted by worry.  You start to have headaches, feel tense, or have nausea.  You urinate more than normal.  You have diarrhea. Get help right away if:  You have a racing heart and shortness of breath.  You have thoughts of hurting yourself or others. If you  ever feel like you may hurt yourself or others, or have thoughts about taking your own life, get help right away. You can go to your nearest emergency department or call:  Your local emergency services (911 in the [place]  A suicide crisis helpline, such as the [facility] at (228)546-5789. This is open 24-hours a day. Summary  Taking steps to deal with stress can help calm you.  Medicines cannot cure anxiety disorders, but they can help ease symptoms.  Family, friends, and partners can play a big part in helping you recover from an anxiety disorder. This information is not intended to replace advice given to you by your health care provider. Make sure you discuss any questions you have with your health care provider. Document Released: 07/02/2016 Document Revised: 06/20/2017 Document Reviewed: 07/02/2016 Elsevier Patient Education  2020 [employer].

## 2019-04-17 ENCOUNTER — Encounter: Payer: Self-pay | Admitting: Nurse Practitioner

## 2019-04-23 ENCOUNTER — Encounter: Payer: Self-pay | Admitting: Nurse Practitioner

## 2019-04-23 NOTE — Progress Notes (Signed)
Abstracted result and sent to scan  

## 2019-06-16 ENCOUNTER — Other Ambulatory Visit: Payer: Self-pay

## 2019-06-16 ENCOUNTER — Ambulatory Visit (HOSPITAL_COMMUNITY)
Admission: EM | Admit: 2019-06-16 | Discharge: 2019-06-16 | Disposition: A | Discharge status: Home / Self Care | Payer: No Typology Code available for payment source | Attending: Family Medicine | Admitting: Family Medicine

## 2019-06-16 ENCOUNTER — Encounter (HOSPITAL_COMMUNITY): Payer: Self-pay

## 2019-06-16 DIAGNOSIS — F419 Anxiety disorder, unspecified: Secondary | ICD-10-CM

## 2019-06-16 DIAGNOSIS — R079 Chest pain, unspecified: Secondary | ICD-10-CM | POA: Diagnosis not present

## 2019-06-16 MED ORDER — HYDROXYZINE HCL 25 MG PO TABS: 25.0000 mg | ORAL_TABLET | Freq: Three times a day (TID) | ORAL | 0 refills | Status: DC | PRN

## 2019-06-16 NOTE — Discharge Instructions (Signed)
Your ekg is reassuring today.  I am glad you have a follow up appointment to recheck your blood pressure.  I feel that both reflux and anxiety can contribute to the symptoms you are experiencing. Your labs at last visit do indicate high blood sugar therefore you likely do need to still be taking your metformin as previously prescribed.  I also recommend taking daily omeprazole to help with reflux symptoms.  May use hydroxizine as needed for anxiety, may be helpful before bed especially, as it may cause drowsiness.  Please go to the ER if you have worsening or constant pain, sweating, nausea, shortness of breath , or otherwise worsening

## 2019-06-16 NOTE — ED Notes (Signed)
Pt EKG were handle to provider N. Burky.

## 2019-06-16 NOTE — ED Triage Notes (Signed)
Pt states having chest [pain on and off x 1 year. Pt present to the UC because of sharp chest pain woke her up this morning. Pt states having left arm and left leg pain x 1 week.

## 2019-06-16 NOTE — ED Provider Notes (Signed)
[facility]    CSN: [id number] Arrival date & time: 06/16/19  [id number]      History   Chief Complaint Chief Complaint  Patient presents with  . Chest Pain  . Shortness of Breath    HPI Tricia Benitez is a 33 y.o. female.   Tricia Benitez presents with complaints of chest pain . This has been an ongoing issue for her. Last night she woke from sleep due to pain. Sharp. Left arm and left leg have been painful also. No constant pain. Daily chest pain which comes and goes. This has been an issue for months now, worse over the past two weeks. Endorses increased anxiety. Was seen here two months ago and was recommended to start omeprazole, which she hasn't taken. Chest pain which does seem to worsen after eating and with laying down. Pain currently 5/10, under left breast and and to left lateral breast, left arm. No nausea or vomiting. Occasionally light headed, denies currently. No cough or congestion. Some shortness of breath , this comes and goes. Has been taking tums which does help, last taken last night. Didn't seem to help then, however. Has an appointment with her primary care provider 11/20. Doesn't smoke. No family history of heart problems. Hasn't been taking her metformin. History  Of asthma, anemia.   ROS per HPI, negative if not otherwise mentioned.      Past Medical History:  Diagnosis Date  . Anemia   . Asthma   . Diabetes mellitus without complication (HCC)    Type II  . UTI (urinary tract infection)     Patient Active Problem List   Diagnosis Date Noted  . Intra-abdominal fluid collection 11/04/2018  . Vitamin D deficiency 05/04/2018  . Left breast mass 04/23/2018    Past Surgical History:  Procedure Laterality Date  . BREAST BIOPSY Left 2015   cyst removed.   Marland Kitchen MOUTH SURGERY     teeth ext   . MYOMECTOMY N/A 09/01/2018   Procedure: ABDOMINAL MYOMECTOMY;  Surgeon: Chancy Milroy, MD;  Location: [facility];  Service: Gynecology;  Laterality: N/A;   . TONSILLECTOMY      OB History    Gravida  1   Para      Term      Preterm      AB  1   Living        SAB      TAB  1   Ectopic      Multiple      Live Births               Home Medications    Prior to Admission medications   Medication Sig Start Date End Date Taking? Authorizing Provider  calcium carbonate (TUMS - DOSED IN MG ELEMENTAL CALCIUM) 500 MG chewable tablet Chew 1 tablet by mouth daily.   Yes [provider]  acetaminophen (TYLENOL) 500 MG tablet Take 1,000 mg by mouth every 6 (six) hours as needed for mild pain or fever.    [provider]  albuterol (VENTOLIN HFA) 108 (90 Base) MCG/ACT inhaler Inhale 2 puffs into the lungs every 6 (six) hours as needed for wheezing or shortness of breath. 11/08/18   Chancy Milroy, MD  Cetirizine HCl 10 MG TBDP Take 10 mg by mouth daily.    [provider]  Ferrous Sulfate (IRON PO) Take by mouth.    [provider]  hydrOXYzine (ATARAX/VISTARIL) 25 MG tablet Take 1 tablet (25  mg total) by mouth every 8 (eight) hours as needed for anxiety. 06/16/19   Zigmund Gottron, NP  metFORMIN (GLUCOPHAGE) 500 MG tablet Take 1 tablet (500 mg total) by mouth 2 (two) times daily with a meal. Patient not taking: Reported on 04/16/2019 01/29/19   Nche, Charlene Brooke, NP  Multiple Vitamins-Minerals (MULTIVITAMIN WITH MINERALS) tablet Take 1 tablet by mouth daily.    [provider]  omeprazole (PRILOSEC) 20 MG capsule Take 1 capsule (20 mg total) by mouth daily. 04/16/19   Zigmund Gottron, NP    Family History Family History  Problem Relation Age of Onset  . Fibroids Mother   . Asthma Mother   . Diabetes Father   . Kidney disease Father   . Alcohol abuse Father   . Diabetes Paternal Grandmother   . Hyperlipidemia Paternal Grandmother   . Hypertension Paternal Grandmother   . Alcohol abuse Maternal Grandmother     Social History Social History   Tobacco Use  . Smoking status:  Never Smoker  . Smokeless tobacco: Never Used  Substance Use Topics  . Alcohol use: Yes    Comment: social  . Drug use: Never     Allergies   Penicillins and Glipizide   Review of Systems Review of Systems   Physical Exam Triage Vital Signs ED Triage Vitals  Enc Vitals Group     BP 06/16/19 0957 (!) 153/96     Pulse Rate 06/16/19 0957 92     Resp 06/16/19 0957 16     Temp 06/16/19 0957 98.7 F (37.1 C)     Temp Source 06/16/19 0957 Oral     SpO2 06/16/19 0957 100 %     Weight --      Height --      Head Circumference --      Peak Flow --      Pain Score 06/16/19 1006 7     Pain Loc --      Pain Edu? --      Excl. in [place]? --    No data found.  Updated Vital Signs BP (!) 153/96 (BP Location: Left Arm)   Pulse 92   Temp 98.7 F (37.1 C) (Oral)   Resp 16   LMP 06/04/2019   SpO2 100%    Physical Exam Constitutional:      General: She is not in acute distress.    Appearance: She is well-developed.  Cardiovascular:     Rate and Rhythm: Normal rate and regular rhythm.     Heart sounds: Normal heart sounds.  Pulmonary:     Breath sounds: Normal breath sounds.  Chest:       Comments: Endorses tenderness to left lateral breast/axillary region on palpation last night, no current tenderness; no palpable masses  Abdominal:     Tenderness: There is no abdominal tenderness.  Musculoskeletal:     Comments: Full ROM of upper and lower extremities; calves are soft and non tender on palpation; strength equal bilaterally; gross sensation intact to upper and lower extremities  Skin:    General: Skin is warm and dry.  Neurological:     Mental Status: She is alert and oriented to person, place, and time.    EKG:  NSR rate of 81 . Previous EKG was available for review. No acute changes as interpreted by me. Noted stwave changes in v1 and v2 which do not appear consistent with acute acs. Reviewed with supervising physician dr. Mannie Stabile.   UC Treatments /  Results  Labs  (all labs ordered are listed, but only abnormal results are displayed) Labs Reviewed - No data to display  EKG   Radiology No results found.  Procedures Procedures (including critical care time)  Medications Ordered in UC Medications - No data to display  Initial Impression / Assessment and Plan / UC Course  I have reviewed the triage vital signs and the nursing notes.  Pertinent labs & imaging results that were available during my care of the patient were reviewed by me and considered in my medical decision making (see chart for details).     Acute on chronic chest pain. ekg and chest xray two months ago wnl. No shortness of breath . ekg today without acute findings. Symptoms and history and risk factors low for acs. Vitals stable. Has not tried gerd treatment. Encouraged to take metformin as prescribed.  Endorses fairly significant anxiety, discussed it's potential contribution to symptoms as well. Vistaril prn. Patient has follow up in 5 days with pcp. Return precautions provided. Patient verbalized understanding and agreeable to plan.  Ambulatory out of clinic without difficulty.   Final Clinical Impressions(s) / UC Diagnoses   Final diagnoses:  Chest pain, unspecified type  Anxiety     Discharge Instructions     Your ekg is reassuring today.  I am glad you have a follow up appointment to recheck your blood pressure.  I feel that both reflux and anxiety can contribute to the symptoms you are experiencing. Your labs at last visit do indicate high blood sugar therefore you likely do need to still be taking your metformin as previously prescribed.  I also recommend taking daily omeprazole to help with reflux symptoms.  May use hydroxizine as needed for anxiety, may be helpful before bed especially, as it may cause drowsiness.  Please go to the ER if you have worsening or constant pain, sweating, nausea, shortness of breath , or otherwise worsening     ED Prescriptions     Medication Sig Dispense Auth. Provider   hydrOXYzine (ATARAX/VISTARIL) 25 MG tablet Take 1 tablet (25 mg total) by mouth every 8 (eight) hours as needed for anxiety. 12 tablet Zigmund Gottron, NP     PDMP not reviewed this encounter.   Zigmund Gottron, NP 06/16/19 1054

## 2019-06-21 ENCOUNTER — Encounter: Payer: Self-pay | Admitting: Nurse Practitioner

## 2019-06-21 ENCOUNTER — Telehealth (INDEPENDENT_AMBULATORY_CARE_PROVIDER_SITE_OTHER): Payer: No Typology Code available for payment source | Admitting: Nurse Practitioner

## 2019-06-21 VITALS — HR 75 | Temp 97.3°F | Ht 65.0 in

## 2019-06-21 DIAGNOSIS — M79604 Pain in right leg: Secondary | ICD-10-CM

## 2019-06-21 DIAGNOSIS — M79605 Pain in left leg: Secondary | ICD-10-CM | POA: Diagnosis not present

## 2019-06-21 DIAGNOSIS — D500 Iron deficiency anemia secondary to blood loss (chronic): Secondary | ICD-10-CM | POA: Diagnosis not present

## 2019-06-21 DIAGNOSIS — R030 Elevated blood-pressure reading, without diagnosis of hypertension: Secondary | ICD-10-CM | POA: Diagnosis not present

## 2019-06-21 NOTE — Progress Notes (Signed)
Virtual Visit via Video Note  I connected with Tricia Benitez on 06/21/19 at 10:45 AM EST by a video enabled telemedicine application and verified that I am speaking with the correct person using two identifiers.  Location: Patient: home Provider:office  only patient and provider present during video call I discussed the limitations of evaluation and management by telemedicine and the availability of in person appointments. The patient expressed understanding and agreed to proceed.  CC:leg pain and chest pain, intermittent, onset 2weeks ago.  History of Present Illness: Bilateral leg pain: Onset 2weeks ago, waxing and waning, worse with walking, denies any injury, no tobacco use, no LE swelling, no numbness or tingling, no muscle weakness, denies any generalized fatigue or joint stiffness. Has not used any tylenol or NSAIDs. She is not taking ferrous sulfate as prescribed.  Chest pain: Onset 03/2019, initially evaluated at [facility], symptoms related to GERD and anxiety. Normal ECG. Omeprazole and vistaril prescribed. She refuses to use vistaril and has not taken omeprazole. CP is worse at HS or in supine position, not associated with diaphoresis or palpitation or with exertion Reports it is improving with use of TUMs prn Elevated BP noted during last ED visit. She has not check BP since discharge. Has intermittent headache, no dizziness, no change in vision.   DM: Noncompliant with medication. last hgbA1c of 7.2 10months ago.  Observations/Objective: Physical Exam  Constitutional: She is oriented to person, place, and time.  Pulmonary/Chest: Effort normal.  Neurological: She is alert and oriented to person, place, and time.   Assessment and Plan: Tricia Benitez was seen today for follow-up.  Diagnoses and all orders for this visit:  Pain in both lower extremities  Iron deficiency anemia due to chronic blood loss  Elevated BP without diagnosis of hypertension   Follow Up  Instructions: See avs   I discussed the assessment and treatment plan with the patient. The patient was provided an opportunity to ask questions and all were answered. The patient agreed with the plan and demonstrated an understanding of the instructions.   The patient was advised to call back or seek an in-person evaluation if the symptoms worsen or if the condition fails to improve as anticipated.  Wilfred Lacy, NP

## 2019-06-21 NOTE — Patient Instructions (Signed)
Resume ferrous sulfate 325mg v 1tab daily, one a day multivitamin 1tab daily, omeprazole 20mg  1tab daily, and metformin as prescribed.  Use tylenol 500mg  or ibuprofen 400mg  every 8hrs prn for pain.  F/up in office 06/25/19 for BP and anemia eval.

## 2019-06-22 ENCOUNTER — Ambulatory Visit: Payer: No Typology Code available for payment source | Admitting: Nurse Practitioner

## 2019-06-24 ENCOUNTER — Other Ambulatory Visit: Payer: Self-pay

## 2019-06-25 ENCOUNTER — Encounter: Payer: Self-pay | Admitting: Nurse Practitioner

## 2019-06-25 ENCOUNTER — Ambulatory Visit (INDEPENDENT_AMBULATORY_CARE_PROVIDER_SITE_OTHER): Payer: No Typology Code available for payment source | Admitting: Nurse Practitioner

## 2019-06-25 VITALS — BP 138/92 | HR 66 | Temp 97.1°F | Ht 65.0 in | Wt 152.4 lb

## 2019-06-25 DIAGNOSIS — Z13220 Encounter for screening for lipoid disorders: Secondary | ICD-10-CM

## 2019-06-25 DIAGNOSIS — R5381 Other malaise: Secondary | ICD-10-CM

## 2019-06-25 DIAGNOSIS — E1165 Type 2 diabetes mellitus with hyperglycemia: Secondary | ICD-10-CM

## 2019-06-25 DIAGNOSIS — D500 Iron deficiency anemia secondary to blood loss (chronic): Secondary | ICD-10-CM

## 2019-06-25 DIAGNOSIS — Z136 Encounter for screening for cardiovascular disorders: Secondary | ICD-10-CM

## 2019-06-25 DIAGNOSIS — K219 Gastro-esophageal reflux disease without esophagitis: Secondary | ICD-10-CM

## 2019-06-25 DIAGNOSIS — R5383 Other fatigue: Secondary | ICD-10-CM

## 2019-06-25 DIAGNOSIS — R030 Elevated blood-pressure reading, without diagnosis of hypertension: Secondary | ICD-10-CM

## 2019-06-25 LAB — CBC WITH DIFFERENTIAL/PLATELET
Basophils Absolute: 0 10*3/uL (ref 0.0–0.1)
Basophils Relative: 0.6 % (ref 0.0–3.0)
Eosinophils Absolute: 0.1 10*3/uL (ref 0.0–0.7)
Eosinophils Relative: 1 % (ref 0.0–5.0)
HCT: 38.7 % (ref 36.0–46.0)
Hemoglobin: 12.5 g/dL (ref 12.0–15.0)
Lymphocytes Relative: 39.3 % (ref 12.0–46.0)
Lymphs Abs: 2.3 10*3/uL (ref 0.7–4.0)
MCHC: 32.4 g/dL (ref 30.0–36.0)
MCV: 74.2 fl — ABNORMAL LOW (ref 78.0–100.0)
Monocytes Absolute: 0.3 10*3/uL (ref 0.1–1.0)
Monocytes Relative: 5 % (ref 3.0–12.0)
Neutro Abs: 3.2 10*3/uL (ref 1.4–7.7)
Neutrophils Relative %: 54.1 % (ref 43.0–77.0)
Platelets: 287 10*3/uL (ref 150.0–400.0)
RBC: 5.21 Mil/uL — ABNORMAL HIGH (ref 3.87–5.11)
RDW: 15.1 % (ref 11.5–15.5)
WBC: 5.9 10*3/uL (ref 4.0–10.5)

## 2019-06-25 LAB — HEMOGLOBIN A1C: Hgb A1c MFr Bld: 7.6 % — ABNORMAL HIGH (ref 4.6–6.5)

## 2019-06-25 LAB — BASIC METABOLIC PANEL
BUN: 14 mg/dL (ref 6–23)
CO2: 21 mEq/L (ref 19–32)
Calcium: 9.4 mg/dL (ref 8.4–10.5)
Chloride: 104 mEq/L (ref 96–112)
Creatinine, Ser: 0.64 mg/dL (ref 0.40–1.20)
GFR: 129.98 mL/min (ref >60.00)
Glucose, Bld: 123 mg/dL — ABNORMAL HIGH (ref 70–99)
Potassium: 3.9 mEq/L (ref 3.5–5.1)
Sodium: 136 mEq/L (ref 135–145)

## 2019-06-25 LAB — LIPID PANEL
Cholesterol: 126 mg/dL (ref 0–200)
HDL: 40.3 mg/dL (ref >39.00)
LDL Cholesterol: 73 mg/dL (ref 0–99)
NonHDL: 85.92
Total CHOL/HDL Ratio: 3
Triglycerides: 67 mg/dL (ref 0.0–149.0)
VLDL: 13.4 mg/dL (ref 0.0–40.0)

## 2019-06-25 LAB — CK: Total CK: 45 U/L (ref 7–177)

## 2019-06-25 LAB — SEDIMENTATION RATE: Sed Rate: 32 mm/hr — ABNORMAL HIGH (ref 0–20)

## 2019-06-25 MED ORDER — OMEPRAZOLE 20 MG PO CPDR: 20.0000 mg | DELAYED_RELEASE_CAPSULE | Freq: Every day | ORAL | 1 refills | Status: DC

## 2019-06-25 NOTE — Patient Instructions (Addendum)
Improved iron panel and stable cbc. Continue ferrous sulfate 325mg  1tab daily. Negative ANA, urinalysis, lipid panel, CK, and sed rate.  normal renal function. HgbA1c of 7.6. Need to resume metformin 500mg  daily. F/up in 70months  Continue to monitor BP 2-3x/week and record. Send BP readings via mychart next Friday.  Will send metformin refill after review of lab results Will let you know when you need to f/up after review of lab results.  Merry Christmas and Happy New Year.

## 2019-06-25 NOTE — Progress Notes (Signed)
Subjective:  Patient ID: Tricia Benitez, female    DOB: 05/13/1986  Age: 33 y.o. MRN: [id number]  CC: Follow-up (follow up on BP--got BP cuff yesterday--123/80 and started to take iron this week/metformin refill)  HPI Elevated BP: Improved BP per patient. Denies headache, resolved chest pain and SOB. BP Readings from Last 3 Encounters:  06/25/19 (!) 138/92  06/16/19 (!) 153/96  04/16/19 139/89   DM: She needs metformin refill. Does not check glucose at home.  She has persistent fatigue and joint pain. Worse with any activity.  Reviewed past Medical, Social and Family history today.  Outpatient Medications Prior to Visit  Medication Sig Dispense Refill  . acetaminophen (TYLENOL) 500 MG tablet Take 1,000 mg by mouth every 6 (six) hours as needed for mild pain or fever.    Marland Kitchen albuterol (VENTOLIN HFA) 108 (90 Base) MCG/ACT inhaler Inhale 2 puffs into the lungs every 6 (six) hours as needed for wheezing or shortness of breath. 1 Inhaler 2  . aspirin EC 81 MG tablet Take 81 mg by mouth daily.    . calcium carbonate (TUMS - DOSED IN MG ELEMENTAL CALCIUM) 500 MG chewable tablet Chew 1 tablet by mouth daily.    . Cetirizine HCl 10 MG TBDP Take 10 mg by mouth daily.    . Multiple Vitamins-Minerals (MULTIVITAMIN WITH MINERALS) tablet Take 1 tablet by mouth daily.    . Ferrous Sulfate (IRON PO) Take by mouth.    . metFORMIN (GLUCOPHAGE) 500 MG tablet Take 1 tablet (500 mg total) by mouth 2 (two) times daily with a meal. 60 tablet 5  . omeprazole (PRILOSEC) 20 MG capsule Take 1 capsule (20 mg total) by mouth daily. 30 capsule 0  . hydrOXYzine (ATARAX/VISTARIL) 25 MG tablet Take 1 tablet (25 mg total) by mouth every 8 (eight) hours as needed for anxiety. (Patient not taking: Reported on 06/25/2019) 12 tablet 0   No facility-administered medications prior to visit.    ROS See HPI  Objective:  BP (!) 138/92   Pulse 66   Temp (!) 97.1 F (36.2 C) (Tympanic)   Ht 5\' 5"  (1.651 m)   Wt 152  lb 6.4 oz (69.1 kg)   LMP 06/04/2019   SpO2 100%   BMI 25.36 kg/m   BP Readings from Last 3 Encounters:  06/25/19 (!) 138/92  06/16/19 (!) 153/96  04/16/19 139/89   Wt Readings from Last 3 Encounters:  06/25/19 152 lb 6.4 oz (69.1 kg)  04/16/19 147 lb 6.4 oz (66.9 kg)  01/29/19 143 lb 9.6 oz (65.1 kg)   Physical Exam Vitals signs reviewed.  Neck:     Musculoskeletal: Normal range of motion and neck supple.  Cardiovascular:     Rate and Rhythm: Normal rate and regular rhythm.     Pulses: Normal pulses.     Heart sounds: Normal heart sounds.  Pulmonary:     Effort: Pulmonary effort is normal.     Breath sounds: Normal breath sounds.  Abdominal:     General: Bowel sounds are normal.     Palpations: Abdomen is soft.  Musculoskeletal:     Right lower leg: No edema.     Left lower leg: No edema.  Neurological:     Mental Status: She is alert and oriented to person, place, and time.  Psychiatric:        Attention and Perception: Attention normal.        Mood and Affect: Mood is depressed.  Speech: Speech normal.        Behavior: Behavior is cooperative.        Thought Content: Thought content does not include homicidal or suicidal ideation. Thought content does not include homicidal or suicidal plan.        Cognition and Memory: Cognition normal.    Lab Results  Component Value Date   WBC 5.9 06/25/2019   HGB 12.5 06/25/2019   HCT 38.7 06/25/2019   PLT 287.0 06/25/2019   GLUCOSE 123 (H) 06/25/2019   CHOL 126 06/25/2019   TRIG 67.0 06/25/2019   HDL 40.30 06/25/2019   LDLCALC 73 06/25/2019   ALT 11 11/04/2018   AST 12 (L) 11/04/2018   NA 136 06/25/2019   K 3.9 06/25/2019   CL 104 06/25/2019   CREATININE 0.64 06/25/2019   BUN 14 06/25/2019   CO2 21 06/25/2019   TSH 1.03 01/29/2019   INR 1.2 11/06/2018   HGBA1C 7.6 (H) 06/25/2019   MICROALBUR 0.9 04/16/2019    No results found.  Assessment & Plan:  This visit occurred during the SARS-CoV-2 public  health emergency.  Safety protocols were in place, including screening questions prior to the visit, additional usage of staff PPE, and extensive cleaning of exam room while observing appropriate contact time as indicated for disinfecting solutions.   [place] was seen today for follow-up.  Diagnoses and all orders for this visit:  Iron deficiency anemia due to chronic blood loss -     Hemoglobin A1c -     Iron, TIBC and Ferritin Panel -     CBC w/Diff -     Ferrous Sulfate (IRON) 325 (65 Fe) MG TABS; Take 1 tablet (325 mg total) by mouth daily after breakfast.  Elevated BP without diagnosis of hypertension -     Hemoglobin A1c -     HTN_1 BMP  Type 2 diabetes mellitus with hyperglycemia, without long-term current use of insulin (HCC) -     Hemoglobin A1c -     HTN_1 BMP -     metFORMIN (GLUCOPHAGE) 500 MG tablet; Take 1 tablet (500 mg total) by mouth daily with breakfast.  Malaise and fatigue -     CK (Creatine Kinase) -     Sedimentation rate -     Antinuclear Antib (ANA) -     Urinalysis w microscopic + reflex cultur  Encounter for lipid screening for cardiovascular disease -     HTN_4 Lipid panel  Gastroesophageal reflux disease without esophagitis -     omeprazole (PRILOSEC) 20 MG capsule; Take 1 capsule (20 mg total) by mouth daily.  Other orders -     REFLEXIVE URINE CULTURE   I have changed Jasie Marchant's Ferrous Sulfate (IRON PO) to Ferrous Sulfate (IRON) 325 (65 Fe) MG TABS. I have also changed her metFORMIN. I am also having her maintain her multivitamin with minerals, acetaminophen, Cetirizine HCl, albuterol, calcium carbonate, hydrOXYzine, aspirin EC, and omeprazole.  Meds ordered this encounter  Medications  . omeprazole (PRILOSEC) 20 MG capsule    Sig: Take 1 capsule (20 mg total) by mouth daily.    Dispense:  30 capsule    Refill:  1    Order Specific Question:   Supervising Provider    Answer:   MATTHEWS, CODY [[id number]]  . metFORMIN (GLUCOPHAGE) 500  MG tablet    Sig: Take 1 tablet (500 mg total) by mouth daily with breakfast.    Dispense:  90 tablet    Refill:  1  Order Specific Question:   Supervising Provider    Answer:   Lucille Passy [[id number]]  . Ferrous Sulfate (IRON) 325 (65 Fe) MG TABS    Sig: Take 1 tablet (325 mg total) by mouth daily after breakfast.    Dispense:  90 tablet    Refill:  1    Order Specific Question:   Supervising Provider    Answer:   Lucille Passy [[id number]]    Problem List Items Addressed This Visit    None    Visit Diagnoses    Iron deficiency anemia due to chronic blood loss    -  Primary   Relevant Medications   Ferrous Sulfate (IRON) 325 (65 Fe) MG TABS   Other Relevant Orders   Hemoglobin A1c (Completed)   Iron, TIBC and Ferritin Panel (Completed)   CBC w/Diff (Completed)   Elevated BP without diagnosis of hypertension       Relevant Orders   Hemoglobin A1c (Completed)   HTN_1 BMP (Completed)   Type 2 diabetes mellitus with hyperglycemia, without long-term current use of insulin (HCC)       Relevant Medications   metFORMIN (GLUCOPHAGE) 500 MG tablet   Other Relevant Orders   Hemoglobin A1c (Completed)   HTN_1 BMP (Completed)   Malaise and fatigue       Relevant Orders   CK (Creatine Kinase) (Completed)   Sedimentation rate (Completed)   Antinuclear Antib (ANA) (Completed)   Urinalysis w microscopic + reflex cultur (Completed)   Encounter for lipid screening for cardiovascular disease       Relevant Orders   HTN_4 Lipid panel (Completed)   Gastroesophageal reflux disease without esophagitis       Relevant Medications   omeprazole (PRILOSEC) 20 MG capsule       Follow-up: Return in about 4 weeks (around 07/23/2019) for HTN (video).  Wilfred Lacy, NP

## 2019-06-26 LAB — IRON,TIBC AND FERRITIN PANEL
%SAT: 34 % (calc) (ref 16–45)
Ferritin: 13 ng/mL — ABNORMAL LOW (ref 16–154)
Iron: 115 ug/dL (ref 40–190)
TIBC: 340 mcg/dL (calc) (ref 250–450)

## 2019-06-26 LAB — URINALYSIS W MICROSCOPIC + REFLEX CULTURE
Bacteria, UA: NONE SEEN /HPF
Bilirubin Urine: NEGATIVE
Glucose, UA: NEGATIVE
Hgb urine dipstick: NEGATIVE
Hyaline Cast: NONE SEEN /LPF
Ketones, ur: NEGATIVE
Leukocyte Esterase: NEGATIVE
Nitrites, Initial: NEGATIVE
Protein, ur: NEGATIVE
RBC / HPF: NONE SEEN /HPF (ref 0–2)
Specific Gravity, Urine: 1.004 (ref 1.001–1.03)
Squamous Epithelial / HPF: NONE SEEN /HPF (ref <=5)
WBC, UA: NONE SEEN /HPF (ref 0–5)
pH: 6 (ref 5.0–8.0)

## 2019-06-26 LAB — NO CULTURE INDICATED

## 2019-06-26 LAB — ANA: Anti Nuclear Antibody (ANA): NEGATIVE

## 2019-06-28 ENCOUNTER — Encounter: Payer: Self-pay | Admitting: Nurse Practitioner

## 2019-06-28 MED ORDER — METFORMIN HCL 500 MG PO TABS: 500.0000 mg | ORAL_TABLET | Freq: Every day | ORAL | 1 refills | Status: DC

## 2019-06-28 MED ORDER — IRON 325 (65 FE) MG PO TABS: 1.0000 | ORAL_TABLET | Freq: Every day | ORAL | 1 refills | Status: DC

## 2019-12-22 ENCOUNTER — Telehealth: Payer: Self-pay | Admitting: Nurse Practitioner

## 2019-12-22 DIAGNOSIS — E1165 Type 2 diabetes mellitus with hyperglycemia: Secondary | ICD-10-CM

## 2019-12-22 NOTE — Telephone Encounter (Signed)
Patient is calling and requesting a refill for metformin sent for [facility] on [facility]. CB is [phone]

## 2019-12-23 NOTE — Telephone Encounter (Signed)
Charlotte please advise.  Pt asking for a refill on:  Metformin 90 tabs   1-refill Last refill-06/28/19 Last OV-06/25/19

## 2019-12-24 MED ORDER — METFORMIN HCL 500 MG PO TABS: 500.0000 mg | ORAL_TABLET | Freq: Every day | ORAL | 0 refills | Status: DC

## 2019-12-24 NOTE — Telephone Encounter (Signed)
Ok to send 90tabs, no refill Need to schedule OV for additional refills

## 2019-12-24 NOTE — Telephone Encounter (Signed)
Pt notified of Rx and need to make an OV for additional refills.

## 2020-06-29 LAB — HM DIABETES EYE EXAM

## 2020-07-24 ENCOUNTER — Ambulatory Visit: Payer: BC Managed Care – PPO | Admitting: Nurse Practitioner

## 2020-07-24 ENCOUNTER — Telehealth: Payer: Self-pay | Admitting: Nurse Practitioner

## 2020-07-24 ENCOUNTER — Encounter: Payer: Self-pay | Admitting: Nurse Practitioner

## 2020-07-24 ENCOUNTER — Other Ambulatory Visit: Payer: Self-pay

## 2020-07-24 VITALS — BP 130/80 | Temp 97.0°F | Ht 66.0 in | Wt 172.8 lb

## 2020-07-24 DIAGNOSIS — Z136 Encounter for screening for cardiovascular disorders: Secondary | ICD-10-CM | POA: Diagnosis not present

## 2020-07-24 DIAGNOSIS — E1165 Type 2 diabetes mellitus with hyperglycemia: Secondary | ICD-10-CM | POA: Diagnosis not present

## 2020-07-24 DIAGNOSIS — Z13220 Encounter for screening for lipoid disorders: Secondary | ICD-10-CM

## 2020-07-24 DIAGNOSIS — D500 Iron deficiency anemia secondary to blood loss (chronic): Secondary | ICD-10-CM

## 2020-07-24 LAB — LIPID PANEL
Cholesterol: 120 mg/dL (ref 0–200)
HDL: 40.2 mg/dL (ref >39.00)
LDL Cholesterol: 63 mg/dL (ref 0–99)
NonHDL: 79.62
Total CHOL/HDL Ratio: 3
Triglycerides: 83 mg/dL (ref 0.0–149.0)
VLDL: 16.6 mg/dL (ref 0.0–40.0)

## 2020-07-24 LAB — BASIC METABOLIC PANEL
BUN: 12 mg/dL (ref 6–23)
CO2: 26 mEq/L (ref 19–32)
Calcium: 9.3 mg/dL (ref 8.4–10.5)
Chloride: 101 mEq/L (ref 96–112)
Creatinine, Ser: 0.63 mg/dL (ref 0.40–1.20)
GFR: 116.69 mL/min (ref >60.00)
Glucose, Bld: 257 mg/dL — ABNORMAL HIGH (ref 70–99)
Potassium: 4 mEq/L (ref 3.5–5.1)
Sodium: 134 mEq/L — ABNORMAL LOW (ref 135–145)

## 2020-07-24 LAB — HEMOGLOBIN A1C: Hgb A1c MFr Bld: 9.5 % — ABNORMAL HIGH (ref 4.6–6.5)

## 2020-07-24 MED ORDER — SITAGLIPTIN PHOS-METFORMIN HCL 50-500 MG PO TABS: 1.0000 | ORAL_TABLET | Freq: Two times a day (BID) | ORAL | 1 refills | Status: DC

## 2020-07-24 NOTE — Progress Notes (Signed)
Subjective:  Patient ID: Tricia Benitez, female    DOB: Jan 26, 1986  Age: 35 y.o. MRN: [id number]  CC: Follow-up (Pt would like to discuss diabetes and changing the dosage of her Metformin, she would like to discuss possibly taking it twice a day. Pt is fasting. Declines flu vaccine)  HPI  Type 2 diabetes mellitus with hyperglycemia, without long-term current use of insulin (HCC) Uncontrolled with fasting glucose: 150-200 per patient. Last HgbA1c12/2020 of 7.6 Current use of metformin 500mg  daily. No neuropathy BP at goal She is aware about need for annual DM eye exam and will schedule.  Entered referral to nutritionist Normal foot exam Check HgbA1c, urine microalbumin, BMP and lipid panel today: ncontrolled Dm as suspected with hgbA1c at 9.5% Changed metformin to metformin/januvia Continue glucose check every other day before breakfast. F/up in 18months. Bring glucose log  Iron deficiency anemia due to chronic blood loss Due to menorrhagia. s/p myomectomy 10/2018 uterine fibroid present Repeat iron panel  Reviewed past Medical, Social and Family history today.  Outpatient Medications Prior to Visit  Medication Sig Dispense Refill  . albuterol (VENTOLIN HFA) 108 (90 Base) MCG/ACT inhaler Inhale 2 puffs into the lungs every 6 (six) hours as needed for wheezing or shortness of breath. 1 Inhaler 2  . calcium carbonate (TUMS - DOSED IN MG ELEMENTAL CALCIUM) 500 MG chewable tablet Chew 1 tablet by mouth daily.    . Cetirizine HCl 10 MG TBDP Take 10 mg by mouth daily.    . Ferrous Sulfate (IRON) 325 (65 Fe) MG TABS Take 1 tablet (325 mg total) by mouth daily after breakfast. 90 tablet 1  . Multiple Vitamins-Minerals (MULTIVITAMIN WITH MINERALS) tablet Take 1 tablet by mouth daily.    . metFORMIN (GLUCOPHAGE) 500 MG tablet Take 1 tablet (500 mg total) by mouth daily with breakfast. No additional refills without an office visit, please schedule. 90 tablet 0  . acetaminophen (TYLENOL)  500 MG tablet Take 1,000 mg by mouth every 6 (six) hours as needed for mild pain or fever.    Marland Kitchen aspirin EC 81 MG tablet Take 81 mg by mouth daily.    . hydrOXYzine (ATARAX/VISTARIL) 25 MG tablet Take 1 tablet (25 mg total) by mouth every 8 (eight) hours as needed for anxiety. (Patient not taking: Reported on 06/25/2019) 12 tablet 0  . omeprazole (PRILOSEC) 20 MG capsule Take 1 capsule (20 mg total) by mouth daily. 30 capsule 1   No facility-administered medications prior to visit.    ROS See HPI  Objective:  BP 130/80 (BP Location: Left Arm, Patient Position: Sitting, Cuff Size: Normal)   Temp (!) 97 F (36.1 C) (Temporal)   Ht 5\' 6"  (1.676 m)   Wt 172 lb 12.8 oz (78.4 kg)   BMI 27.89 kg/m   Physical Exam Vitals reviewed.  Neck:     Thyroid: No thyroid mass, thyromegaly or thyroid tenderness.  Cardiovascular:     Rate and Rhythm: Normal rate and regular rhythm.     Pulses: Normal pulses.     Heart sounds: Normal heart sounds.  Pulmonary:     Effort: Pulmonary effort is normal.  Musculoskeletal:     Right lower leg: No edema.     Left lower leg: No edema.  Lymphadenopathy:     Cervical: No cervical adenopathy.  Skin:    General: Skin is warm and dry.  Neurological:     Mental Status: She is alert and oriented to person, place, and time.  Assessment & Plan:  This visit occurred during the SARS-CoV-2 public health emergency.  Safety protocols were in place, including screening questions prior to the visit, additional usage of staff PPE, and extensive cleaning of exam room while observing appropriate contact time as indicated for disinfecting solutions.   Elvia was seen today for follow-up.  Diagnoses and all orders for this visit:  Type 2 diabetes mellitus with hyperglycemia, without long-term current use of insulin (HCC) -     Hemoglobin A1c -     Cancel: Microalbumin / creatinine urine ratio -     Basic metabolic panel -     Referral to Nutrition and Diabetes  Services -     Microalbumin / creatinine urine ratio; Future -     sitaGLIPtin-metformin (JANUMET) 50-500 MG tablet; Take 1 tablet by mouth 2 (two) times daily with a meal.  Iron deficiency anemia due to chronic blood loss -     Iron, TIBC and Ferritin Panel  Encounter for lipid screening for cardiovascular disease -     Lipid panel    Problem List Items Addressed This Visit      Endocrine   Type 2 diabetes mellitus with hyperglycemia, without long-term current use of insulin (HCC) - Primary    Uncontrolled with fasting glucose: 150-200 per patient. Last HgbA1c12/2020 of 7.6 Current use of metformin 500mg  daily. No neuropathy BP at goal She is aware about need for annual DM eye exam and will schedule.  Entered referral to nutritionist Normal foot exam Check HgbA1c, urine microalbumin, BMP and lipid panel today: ncontrolled Dm as suspected with hgbA1c at 9.5% Changed metformin to metformin/januvia Continue glucose check every other day before breakfast. F/up in 54months. Bring glucose log      Relevant Medications   sitaGLIPtin-metformin (JANUMET) 50-500 MG tablet   Other Relevant Orders   Hemoglobin A1c (Completed)   Basic metabolic panel (Completed)   Referral to Nutrition and Diabetes Services   Microalbumin / creatinine urine ratio     Other   Iron deficiency anemia due to chronic blood loss    Due to menorrhagia. s/p myomectomy 10/2018 uterine fibroid present Repeat iron panel      Relevant Orders   Iron, TIBC and Ferritin Panel    Other Visit Diagnoses    Encounter for lipid screening for cardiovascular disease       Relevant Orders   Lipid panel (Completed)      Follow-up: Return in about 3 months (around 10/22/2020) for CPE (fasting).  12/22/2020, NP

## 2020-07-24 NOTE — Telephone Encounter (Signed)
Patient is calling to get a prescription for a blood glucose monitor. She wanted the continuous one, but her insurance will not pay for it. Please call her at [phone] if you have any questions.

## 2020-07-24 NOTE — Assessment & Plan Note (Addendum)
Due to menorrhagia. s/p myomectomy 10/2018 uterine fibroid present Repeat iron panel

## 2020-07-24 NOTE — Assessment & Plan Note (Addendum)
Uncontrolled with fasting glucose: 150-200 per patient. Last HgbA1c12/2020 of 7.6 Current use of metformin 500mg  daily. No neuropathy BP at goal She is aware about need for annual DM eye exam and will schedule.  Entered referral to nutritionist Normal foot exam Check HgbA1c, urine microalbumin, BMP and lipid panel today: ncontrolled Dm as suspected with hgbA1c at 9.5% Changed metformin to metformin/januvia Continue glucose check every other day before breakfast. F/up in 30months. Bring glucose log

## 2020-07-24 NOTE — Patient Instructions (Addendum)
Go to lab for blood draw and urine collection 

## 2020-07-25 LAB — IRON,TIBC AND FERRITIN PANEL
%SAT: 10 % (calc) — ABNORMAL LOW (ref 16–45)
Ferritin: 15 ng/mL — ABNORMAL LOW (ref 16–154)
Iron: 33 ug/dL — ABNORMAL LOW (ref 40–190)
TIBC: 332 mcg/dL (calc) (ref 250–450)

## 2020-07-25 MED ORDER — IRON 325 (65 FE) MG PO TABS: 1.0000 | ORAL_TABLET | Freq: Every day | ORAL | 1 refills | Status: DC

## 2020-07-25 MED ORDER — METFORMIN HCL 850 MG PO TABS: 850.0000 mg | ORAL_TABLET | Freq: Two times a day (BID) | ORAL | 1 refills | Status: DC

## 2020-07-26 MED ORDER — BLOOD GLUCOSE MONITOR KIT: PACK | 0 refills | Status: AC

## 2020-07-26 NOTE — Addendum Note (Signed)
Addended by: Alysia Penna L on: 07/26/2020 03:09 PM   Modules accepted: Orders

## 2020-07-26 NOTE — Telephone Encounter (Signed)
LVM for patient to return call. 

## 2020-07-26 NOTE — Telephone Encounter (Signed)
Pt states the one she currently has is one she just bought over the counter and she would like one prescribed through her insurance due to cost. Pt states her insurance will cover the meter and strips instead of her paying for these things out of pocket.

## 2020-07-26 NOTE — Telephone Encounter (Signed)
Nurse visit scheduled.

## 2020-07-26 NOTE — Telephone Encounter (Signed)
She has a current glucometer and in previous telephone note, I requested for her to bring that meter to next OV to verify accuracy. Or she can schedule a nurse visit sooner to verify this

## 2020-10-23 IMAGING — CT CT ABDOMEN AND PELVIS WITH CONTRAST
2 of 4 series · 15 of 46 positions shown, 17 images · IV contrast (APPLIED)
Comparison: Multiple prior, most remote 04/23/2018, most recent
11/04/2018

CLINICAL DATA: 32-year-old female with abscess

EXAM:
CT ABDOMEN AND PELVIS WITH CONTRAST
TECHNIQUE: Multidetector CT imaging of the abdomen and pelvis was performed
using the standard protocol following bolus administration of
intravenous contrast.
CONTRAST:  100mL OMNIPAQUE IOHEXOL 300 MG/ML  SOLN

[Series 2: axial st · axial · 0.83mm/px · z∈[-613,-168]mm · 12 of 101 slices shown, 14 images]
[im 6/101  soft-tissue]
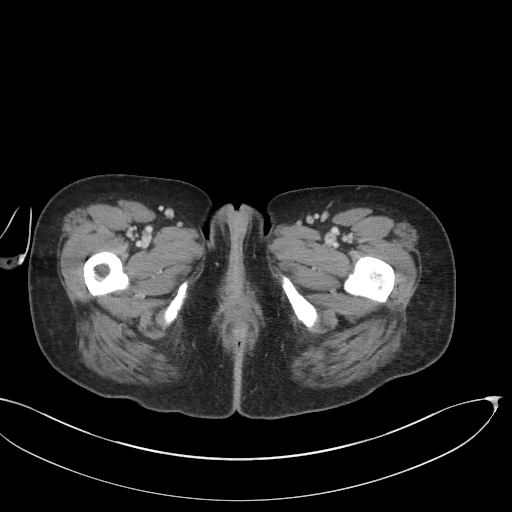
[im 6/101  bone]
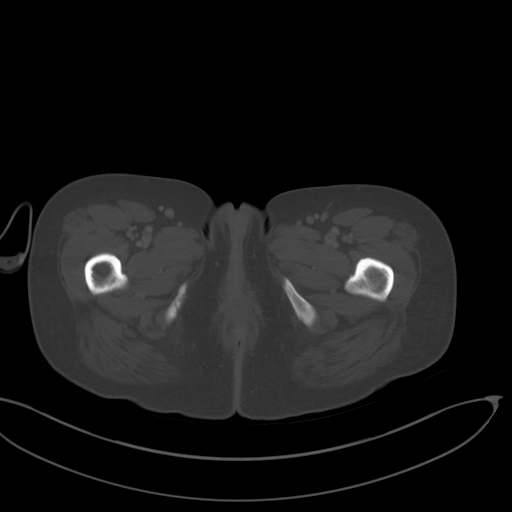
[im 16/101  soft-tissue]
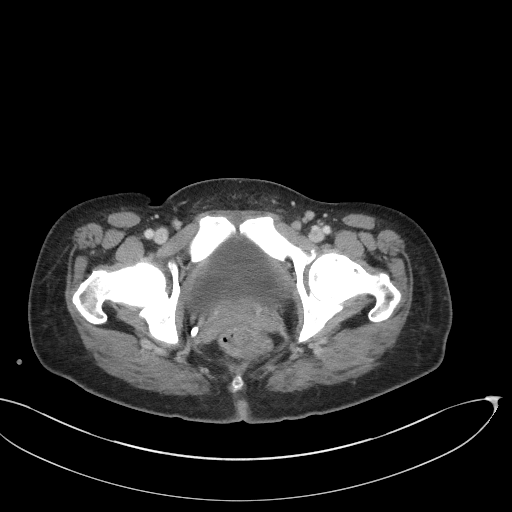
[im 22/101  soft-tissue]
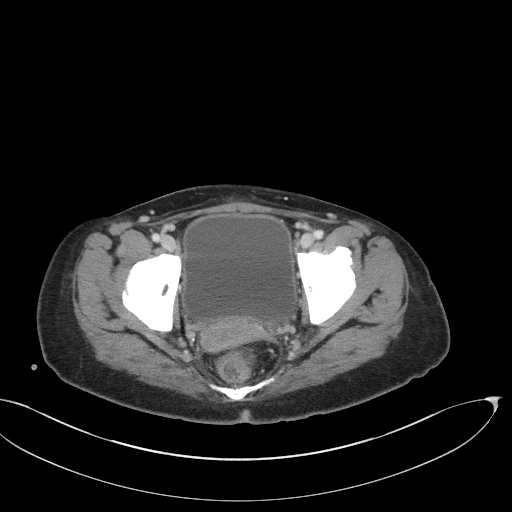
[im 32/101  soft-tissue]
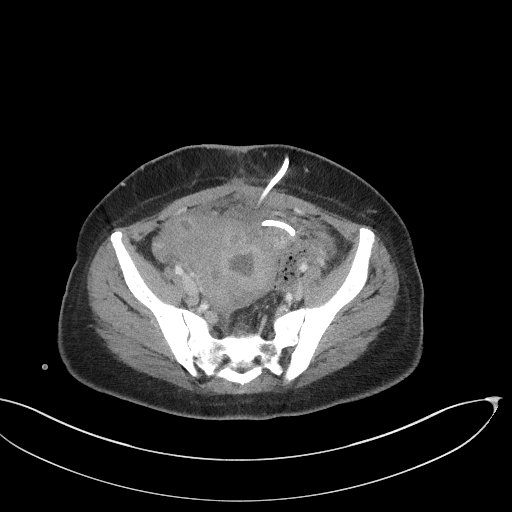
[im 37/101  soft-tissue]
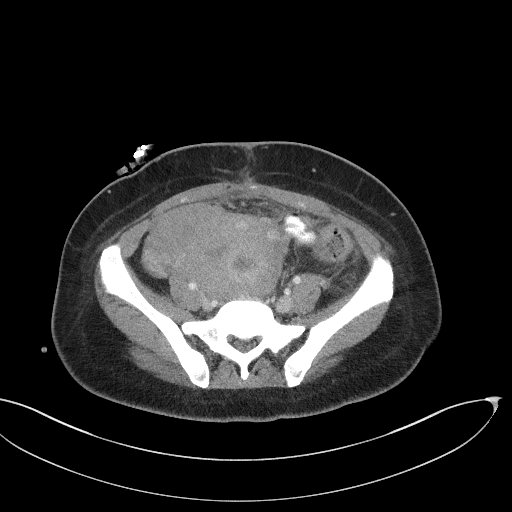
[im 48/101  soft-tissue]
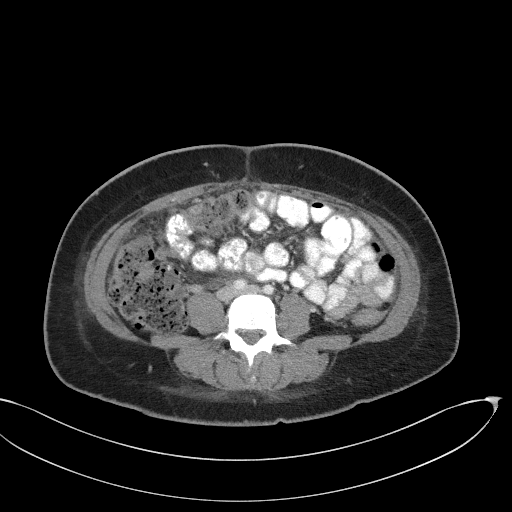
[im 53/101  soft-tissue]
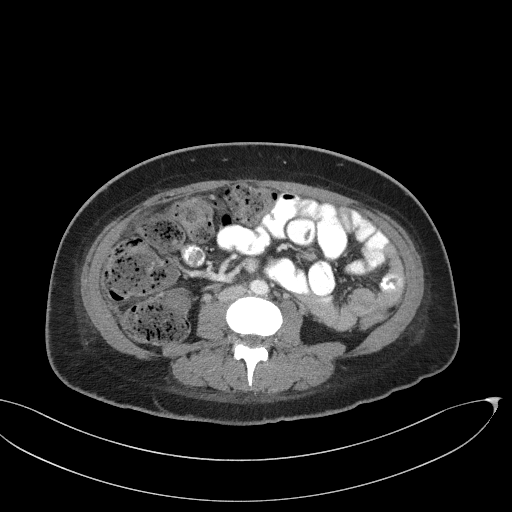
[im 64/101  soft-tissue]
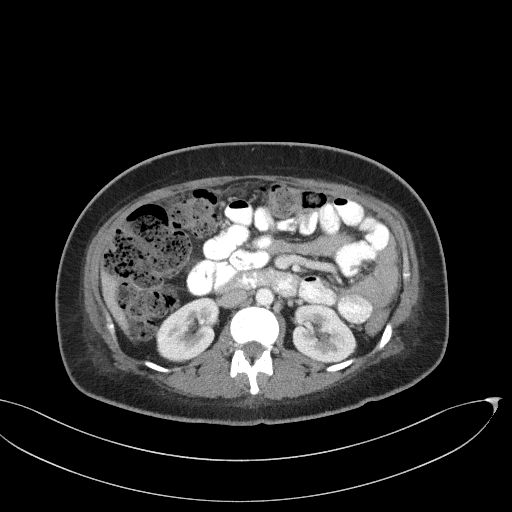
[im 69/101  soft-tissue]
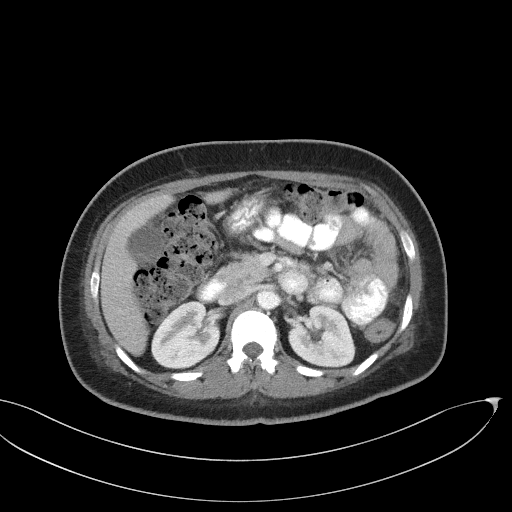
[im 69/101  bone]
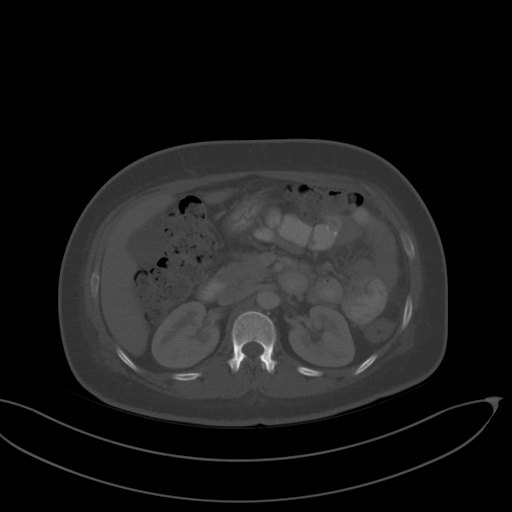
[im 79/101  soft-tissue]
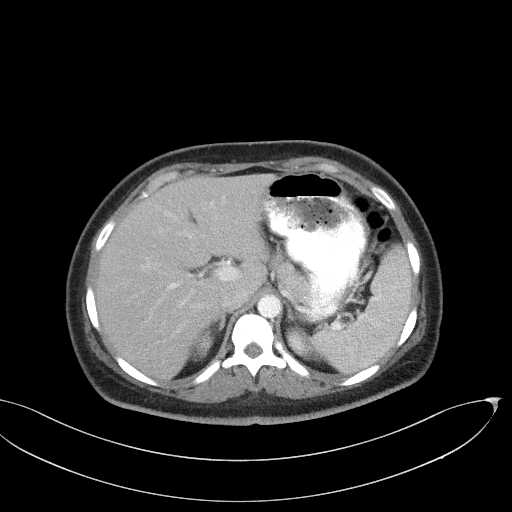
[im 85/101  soft-tissue]
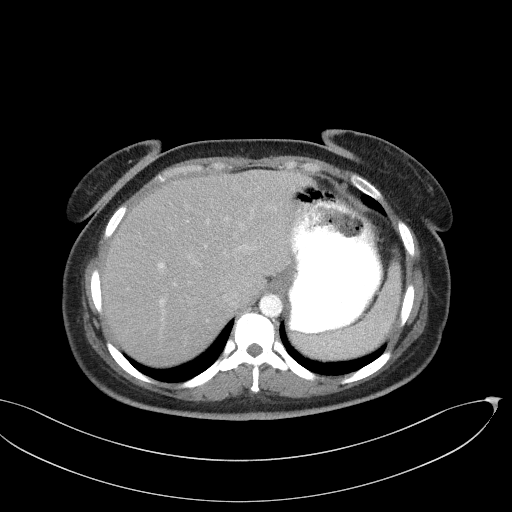
[im 95/101  soft-tissue]
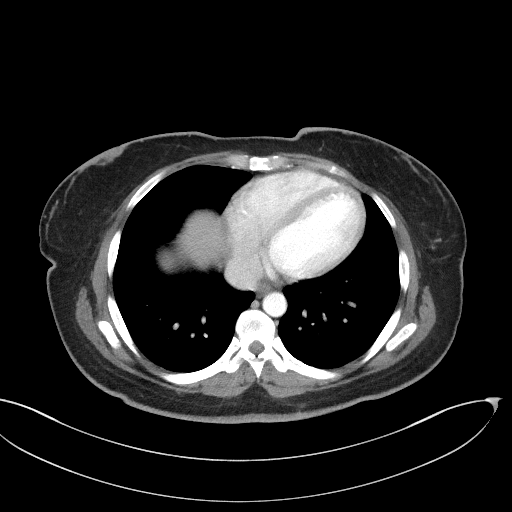

[Series 4: coronal st · coronal · 0.68mm/px · 3 of 85 slices shown]
[im 29/85  soft-tissue]
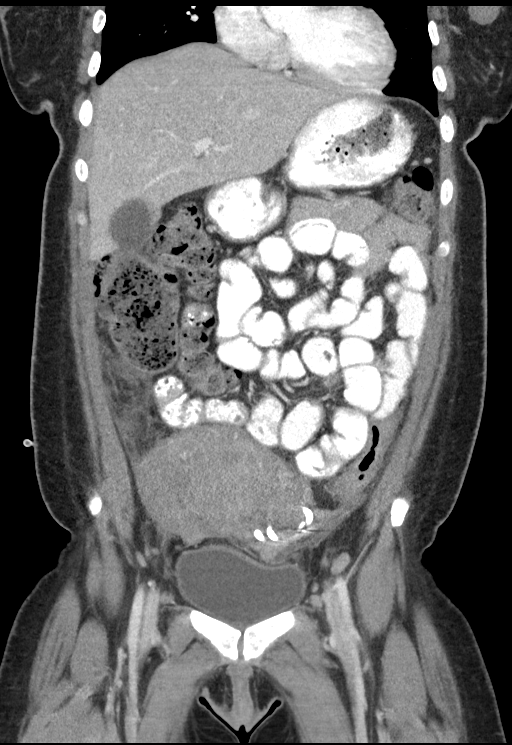
[im 38/85  soft-tissue]
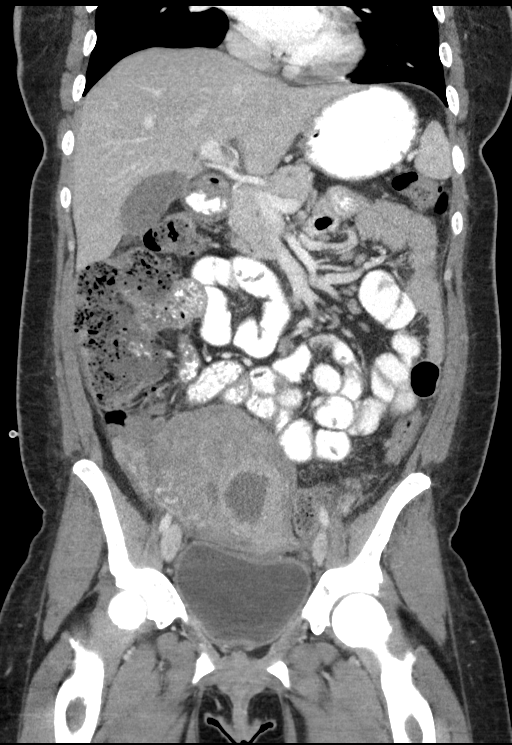
[im 47/85  soft-tissue]
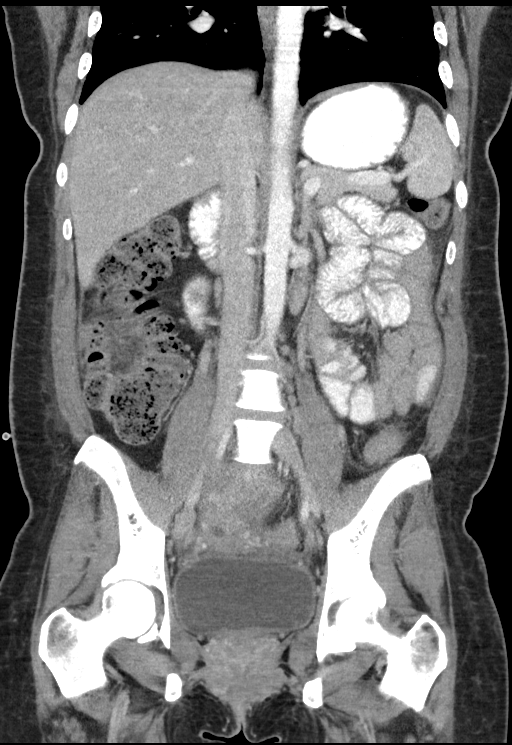

[15 of 46 positions shown; findings below may reference images not displayed]

FINDINGS: Lower chest: Soft tissue nodule within the left breast previously
measured 2.5 cm. Current measurement 2.9 cm. No acute finding of the
lower chest.

Hepatobiliary: Unremarkable liver.  Unremarkable gallbladder

Pancreas: Unremarkable

Spleen: Unremarkable

Adrenals/Urinary Tract: Unremarkable appearance of the adrenal
glands. No evidence of hydronephrosis of the right or left kidney.
No nephrolithiasis. Unremarkable course of the bilateral ureters.
Unremarkable appearance of the urinary bladder.

Stomach/Bowel: Unremarkable stomach. Unremarkable small bowel.
Moderate to large stool burden. No obstruction. No inflammatory
changes of the colon.

Vascular/Lymphatic: No atherosclerotic changes. Unremarkable aorta,
bilateral iliac arteries, proximal femoral arteries.

Small lymph nodes in the periaortic nodal station, with likely
reactive nodes of the bilateral iliac nodal station

Reproductive: Surgical changes of myomectomy again noted. Trace
fluid within the endometrial canal. Decreasing size of rim enhancing
fluid collection within the central uterus, presumably surgical
cavity, previously 4.2 cm, currently 2.8 cm. Ill-defined linear
hypodensity in the right aspect of the uterine fundus measures 12
mm, decreased from 14 mm.

Unchanged position of anterior approach percutaneous drainage
catheter, terminating anterior to the uterine fundus and left
adnexa. There is near complete resolution of the fluid collection,
with scant residual fluid and inflammation adjacent to the surgical
drain.

No new fluid collection.

Other: Surgical changes of midline abdomen.

Musculoskeletal: No acute displaced fracture.  Degenerative changes.
IMPRESSION: Unchanged position of anterior abdominal drainage catheter, with
near complete resolution of the abscess anterior to the uterine
fundus/left adnexa. Scant residual fluid/inflammation.

Decreasing size of rim enhancing collection in the central fibroid
uterus, presumably surgical cavity/defect, previously 4.2 cm,
currently 2.8 cm.

Decreasing size of linear hypodensity in the right uterine fundus,
presumably postsurgical.

## 2020-10-24 ENCOUNTER — Other Ambulatory Visit: Payer: Self-pay | Admitting: Nurse Practitioner

## 2020-10-25 ENCOUNTER — Encounter: Payer: Self-pay | Admitting: Nurse Practitioner

## 2020-11-01 ENCOUNTER — Ambulatory Visit: Payer: BC Managed Care – PPO | Admitting: Nurse Practitioner

## 2021-01-03 ENCOUNTER — Other Ambulatory Visit: Payer: Self-pay

## 2021-01-04 ENCOUNTER — Encounter: Payer: Self-pay | Admitting: Nurse Practitioner

## 2021-01-04 ENCOUNTER — Ambulatory Visit: Payer: BC Managed Care – PPO | Admitting: Nurse Practitioner

## 2021-01-04 VITALS — BP 118/80 | HR 60 | Temp 97.4°F | Ht 66.0 in | Wt 172.6 lb

## 2021-01-04 DIAGNOSIS — E1165 Type 2 diabetes mellitus with hyperglycemia: Secondary | ICD-10-CM | POA: Diagnosis not present

## 2021-01-04 DIAGNOSIS — D500 Iron deficiency anemia secondary to blood loss (chronic): Secondary | ICD-10-CM

## 2021-01-04 LAB — MICROALBUMIN / CREATININE URINE RATIO
Creatinine,U: 40.8 mg/dL
Microalb Creat Ratio: 1.7 mg/g (ref 0.0–30.0)
Microalb, Ur: <0.7 mg/dL (ref 0.0–1.9)

## 2021-01-04 LAB — HEMOGLOBIN A1C: Hgb A1c MFr Bld: 10.1 % — ABNORMAL HIGH (ref 4.6–6.5)

## 2021-01-04 MED ORDER — PIOGLITAZONE HCL 15 MG PO TABS: 15.0000 mg | ORAL_TABLET | Freq: Every day | ORAL | 5 refills | Status: DC

## 2021-01-04 MED ORDER — OZEMPIC (0.25 OR 0.5 MG/DOSE) 2 MG/1.5ML ~~LOC~~ SOPN: PEN_INJECTOR | SUBCUTANEOUS | 2 refills | Status: DC

## 2021-01-04 NOTE — Patient Instructions (Signed)
Go to lab for blood draw and urine collection Stop metformin Start ozempic injection and actos tabs Monitor gluocse daily before breaskfast Bring glucose readings to next appt.  Semaglutide injection solution What is this medication? SEMAGLUTIDE (Sem a GLOO tide) is used to improve blood sugar control in adults with type 2 diabetes. This medicine may be used with other diabetes medicines. This drug may also reduce the risk of heart attack or stroke if you have type 2diabetes and risk factors for heart disease. This medicine may be used for other purposes; ask your health care provider orpharmacist if you have questions. COMMON BRAND NAME(S): OZEMPIC What should I tell my care team before I take this medication? They need to know if you have any of these conditions: endocrine tumors (MEN 2) or if someone in your family had these tumors eye disease, vision problems history of pancreatitis kidney disease stomach problems thyroid cancer or if someone in your family had thyroid cancer an unusual or allergic reaction to semaglutide, other medicines, foods, dyes, or preservatives pregnant or trying to get pregnant breast-feeding How should I use this medication? This medicine is for injection under the skin of your upper leg (thigh), stomach area, or upper arm. It is given once every week (every 7 days). You will be taught how to prepare and give this medicine. Use exactly as directed. Take your medicine at regular intervals. Do not take it more often thandirected. If you use this medicine with insulin, you should inject this medicine and the insulin separately. Do not mix them together. Do not give the injections rightnext to each other. Change (rotate) injection sites with each injection. It is important that you put your used needles and syringes in a special sharps container. Do not put them in a trash can. If you do not have a sharpscontainer, call your pharmacist or healthcare provider to get  one. A special MedGuide will be given to you by the pharmacist with eachprescription and refill. Be sure to read this information carefully each time. This drug comes with INSTRUCTIONS FOR USE. Ask your pharmacist for directions on how to use this drug. Read the information carefully. Talk to yourpharmacist or health care provider if you have questions. Talk to your pediatrician regarding the use of this medicine in children.Special care may be needed. Overdosage: If you think you have taken too much of this medicine contact apoison control center or emergency room at once. NOTE: This medicine is only for you. Do not share this medicine with others. What if I miss a dose? If you miss a dose, take it as soon as you can within 5 days after the missed dose. Then take your next dose at your regular weekly time. If it has been longer than 5 days after the missed dose, do not take the missed dose. Take the next dose at your regular time. Do not take double or extra doses. If you havequestions about a missed dose, contact your health care provider for advice. What may interact with this medication? other medicines for diabetes Many medications may cause changes in blood sugar, these include: alcohol containing beverages antiviral medicines for HIV or AIDS aspirin and aspirin-like drugs certain medicines for blood pressure, heart disease, irregular heart beat chromium diuretics female hormones, such as estrogens or progestins, birth control pills fenofibrate gemfibrozil isoniazid lanreotide female hormones or anabolic steroids MAOIs like Carbex, Eldepryl, Marplan, Nardil, and Parnate medicines for weight loss medicines for allergies, asthma, cold, or cough medicines for  depression, anxiety, or psychotic disturbances niacin nicotine NSAIDs, medicines for pain and inflammation, like ibuprofen or naproxen octreotide pasireotide pentamidine phenytoin probenecid quinolone antibiotics such as  ciprofloxacin, levofloxacin, ofloxacin some herbal dietary supplements steroid medicines such as prednisone or cortisone sulfamethoxazole; trimethoprim thyroid hormones Some medications can hide the warning symptoms of low blood sugar (hypoglycemia). You may need to monitor your blood sugar more closely if youare taking one of these medications. These include: beta-blockers, often used for high blood pressure or heart problems (examples include atenolol, metoprolol, propranolol) clonidine guanethidine reserpine This list may not describe all possible interactions. Give your health care provider a list of all the medicines, herbs, non-prescription drugs, or dietary supplements you use. Also tell them if you smoke, drink alcohol, or use illegaldrugs. Some items may interact with your medicine. What should I watch for while using this medication? Visit your doctor or health care professional for regular checks on yourprogress. Drink plenty of fluids while taking this medicine. Check with your doctor or health care professional if you get an attack of severe diarrhea, nausea, and vomiting. The loss of too much body fluid can make it dangerous for you to takethis medicine. A test called the HbA1C (A1C) will be monitored. This is a simple blood test. It measures your blood sugar control over the last 2 to 3 months. You willreceive this test every 3 to 6 months. Learn how to check your blood sugar. Learn the symptoms of low and high bloodsugar and how to manage them. Always carry a quick-source of sugar with you in case you have symptoms of low blood sugar. Examples include hard sugar candy or glucose tablets. Make sure others know that you can choke if you eat or drink when you develop serious symptoms of low blood sugar, such as seizures or unconsciousness. They must getmedical help at once. Tell your doctor or health care professional if you have high blood sugar. You might need to change the dose of  your medicine. If you are sick or exercisingmore than usual, you might need to change the dose of your medicine. Do not skip meals. Ask your doctor or health care professional if you should avoid alcohol. Many nonprescription cough and cold products contain sugar oralcohol. These can affect blood sugar. Pens should never be shared. Even if the needle is changed, sharing may resultin passing of viruses like hepatitis or HIV. Wear a medical ID bracelet or chain, and carry a card that describes yourdisease and details of your medicine and dosage times. Do not become pregnant while taking this medicine. Women should inform their doctor if they wish to become pregnant or think they might be pregnant. There is a potential for serious side effects to an unborn child. Talk to your healthcare professional or pharmacist for more information. What side effects may I notice from receiving this medication? Side effects that you should report to your doctor or health care professionalas soon as possible: allergic reactions like skin rash, itching or hives, swelling of the face, lips, or tongue breathing problems changes in vision diarrhea that continues or is severe lump or swelling on the neck severe nausea signs and symptoms of infection like fever or chills; cough; sore throat; pain or trouble passing urine signs and symptoms of low blood sugar such as feeling anxious, confusion, dizziness, increased hunger, unusually weak or tired, sweating, shakiness, cold, irritable, headache, blurred vision, fast heartbeat, loss of consciousness signs and symptoms of kidney injury like trouble passing urine or change  in the amount of urine trouble swallowing unusual stomach upset or pain vomiting Side effects that usually do not require medical attention (report to yourdoctor or health care professional if they continue or are bothersome): constipation diarrhea nausea pain, redness, or irritation at site where  injected stomach upset This list may not describe all possible side effects. Call your doctor for medical advice about side effects. You may report side effects to FDA at1-800-FDA-1088. Where should I keep my medication? Keep out of the reach of children. Store unopened pens in a refrigerator between 2 and 8 degrees C (36 and 46 degrees F). Do not freeze. Protect from light and heat. After you first use the pen, it can be stored for 56 days at room temperature between 15 and 30 degrees C (59 and 86 degrees F) or in a refrigerator. Throw away your used pen after 56days or after the expiration date, whichever comes first. Do not store your pen with the needle attached. If the needle is left on,medicine may leak from the pen. NOTE: This sheet is a summary. It may not cover all possible information. If you have questions about this medicine, talk to your doctor, pharmacist, orhealth care provider.  2022 Elsevier/Gold Standard (2019-03-23 09:41:51)

## 2021-01-04 NOTE — Progress Notes (Signed)
Subjective:  Patient ID: Tricia Benitez, female    DOB: July 20, 1986  Age: 35 y.o. MRN: [id number]  CC: Follow-up (F/u on DM/Pt checks blood sugars at home stating it runs around 250/Pt is fasting. )  HPI  Type 2 diabetes mellitus with hyperglycemia, without long-term current use of insulin (HCC) Uncontrolled with fasting glucose>250 hgbA1c of 10% She declined referral to nutritionist. States she has been compliant with low carb/low sugar diet She is unable to tolerate metformin: nausea and bloating  Stop metformin Start ozempic injection and actos tabs Monitor gluocse daily before breaskfast Bring glucose readings to next appt in 21month  Reviewed past Medical, Social and Family history today.  Outpatient Medications Prior to Visit  Medication Sig Dispense Refill   ACCU-CHEK GUIDE test strip TEST BLOOD SUGAR ONCE DAILY BEFORE BREAKFAST 100 strip 3   Accu-Chek Softclix Lancets lancets every morning.     albuterol (VENTOLIN HFA) 108 (90 Base) MCG/ACT inhaler Inhale 2 puffs into the lungs every 6 (six) hours as needed for wheezing or shortness of breath. 1 Inhaler 2   blood glucose meter kit and supplies KIT Dispense based on patient and insurance preference. Use once a day before breakfast. ICD 10: E11.65 1 each 0   calcium carbonate (TUMS - DOSED IN MG ELEMENTAL CALCIUM) 500 MG chewable tablet Chew 1 tablet by mouth daily.     Cetirizine HCl 10 MG TBDP Take 10 mg by mouth daily.     Ferrous Sulfate (IRON) 325 (65 Fe) MG TABS Take 1 tablet (325 mg total) by mouth daily after breakfast. 90 tablet 1   Omega-3 Fatty Acids (FISH OIL PO) Take by mouth.     metFORMIN (GLUCOPHAGE) 850 MG tablet Take 1 tablet (850 mg total) by mouth 2 (two) times daily with a meal. 180 tablet 1   Multiple Vitamins-Minerals (MULTIVITAMIN WITH MINERALS) tablet Take 1 tablet by mouth daily. (Patient not taking: Reported on 01/04/2021)     No facility-administered medications prior to visit.    ROS See  HPI  Objective:  BP 118/80 (BP Location: Left Arm, Patient Position: Sitting, Cuff Size: Normal)   Pulse 60   Temp (!) 97.4 F (36.3 C) (Temporal)   Ht 5' 6"  (1.676 m)   Wt 172 lb 9.6 oz (78.3 kg)   LMP 12/30/2020   SpO2 100%   BMI 27.86 kg/m   Physical Exam  Assessment & Plan:  This visit occurred during the SARS-CoV-2 public health emergency.  Safety protocols were in place, including screening questions prior to the visit, additional usage of staff PPE, and extensive cleaning of exam room while observing appropriate contact time as indicated for disinfecting solutions.   SGuywas seen today for follow-up.  Diagnoses and all orders for this visit:  Type 2 diabetes mellitus with hyperglycemia, without long-term current use of insulin (HCC) -     Hemoglobin A1c -     Microalbumin / creatinine urine ratio -     Semaglutide,0.25 or 0.5MG/DOS, (OZEMPIC, 0.25 OR 0.5 MG/DOSE,) 2 MG/1.5ML SOPN; 0.29mweekly x 2weeks, then 0.5weekly continuously -     pioglitazone (ACTOS) 15 MG tablet; Take 1 tablet (15 mg total) by mouth daily.  Iron deficiency anemia due to chronic blood loss -     Iron, TIBC and Ferritin Panel  Problem List Items Addressed This Visit       Endocrine   Type 2 diabetes mellitus with hyperglycemia, without long-term current use of insulin ([place]- Primary  Uncontrolled with fasting glucose>250 hgbA1c of 10% She declined referral to nutritionist. States she has been compliant with low carb/low sugar diet She is unable to tolerate metformin: nausea and bloating  Stop metformin Start ozempic injection and actos tabs Monitor gluocse daily before breaskfast Bring glucose readings to next appt in 66month      Relevant Medications   Semaglutide,0.25 or 0.5MG/DOS, (OZEMPIC, 0.25 OR 0.5 MG/DOSE,) 2 MG/1.5ML SOPN   pioglitazone (ACTOS) 15 MG tablet   Other Relevant Orders   Hemoglobin A1c (Completed)   Microalbumin / creatinine urine ratio (Completed)      Other   Iron deficiency anemia due to chronic blood loss   Relevant Orders   Iron, TIBC and Ferritin Panel (Completed)    Follow-up: Return in about 6 weeks (around 02/15/2021) for DM (371ms).  ChWilfred LacyNP

## 2021-01-05 ENCOUNTER — Encounter: Payer: Self-pay | Admitting: Nurse Practitioner

## 2021-01-05 LAB — IRON,TIBC AND FERRITIN PANEL
%SAT: 11 % (calc) — ABNORMAL LOW (ref 16–45)
Ferritin: 13 ng/mL — ABNORMAL LOW (ref 16–154)
Iron: 37 ug/dL — ABNORMAL LOW (ref 40–190)
TIBC: 342 mcg/dL (calc) (ref 250–450)

## 2021-01-05 NOTE — Assessment & Plan Note (Signed)
Uncontrolled with fasting glucose>250 hgbA1c of 10% She declined referral to nutritionist. States she has been compliant with low carb/low sugar diet She is unable to tolerate metformin: nausea and bloating  Stop metformin Start ozempic injection and actos tabs Monitor gluocse daily before breaskfast Bring glucose readings to next appt in 34month

## 2021-01-21 ENCOUNTER — Other Ambulatory Visit: Payer: Self-pay | Admitting: Nurse Practitioner

## 2021-01-21 DIAGNOSIS — D500 Iron deficiency anemia secondary to blood loss (chronic): Secondary | ICD-10-CM

## 2021-01-23 NOTE — Telephone Encounter (Signed)
Chart supports Rx Last OV 01/04/21

## 2021-04-04 ENCOUNTER — Other Ambulatory Visit: Payer: Self-pay | Admitting: Nurse Practitioner

## 2021-04-04 DIAGNOSIS — E1165 Type 2 diabetes mellitus with hyperglycemia: Secondary | ICD-10-CM

## 2021-04-23 ENCOUNTER — Encounter: Payer: Self-pay | Admitting: Nurse Practitioner

## 2021-04-23 DIAGNOSIS — E1165 Type 2 diabetes mellitus with hyperglycemia: Secondary | ICD-10-CM

## 2021-04-25 MED ORDER — OZEMPIC (0.25 OR 0.5 MG/DOSE) 2 MG/1.5ML ~~LOC~~ SOPN: PEN_INJECTOR | SUBCUTANEOUS | 2 refills | Status: DC

## 2021-05-07 ENCOUNTER — Ambulatory Visit: Payer: BC Managed Care – PPO | Admitting: Nurse Practitioner

## 2021-05-07 ENCOUNTER — Encounter: Payer: Self-pay | Admitting: Nurse Practitioner

## 2021-05-07 ENCOUNTER — Other Ambulatory Visit: Payer: Self-pay

## 2021-05-07 VITALS — BP 120/68 | HR 78 | Temp 97.0°F | Ht 65.0 in | Wt 175.0 lb

## 2021-05-07 DIAGNOSIS — E1165 Type 2 diabetes mellitus with hyperglycemia: Secondary | ICD-10-CM | POA: Diagnosis not present

## 2021-05-07 LAB — HEPATIC FUNCTION PANEL
ALT: 9 U/L (ref 0–35)
AST: 11 U/L (ref 0–37)
Albumin: 4.3 g/dL (ref 3.5–5.2)
Alkaline Phosphatase: 42 U/L (ref 39–117)
Bilirubin, Direct: 0.1 mg/dL (ref 0.0–0.3)
Total Bilirubin: 0.3 mg/dL (ref 0.2–1.2)
Total Protein: 7.3 g/dL (ref 6.0–8.3)

## 2021-05-07 LAB — LDL CHOLESTEROL, DIRECT: Direct LDL: 66 mg/dL

## 2021-05-07 LAB — BASIC METABOLIC PANEL
BUN: 10 mg/dL (ref 6–23)
CO2: 25 mEq/L (ref 19–32)
Calcium: 9.4 mg/dL (ref 8.4–10.5)
Chloride: 101 mEq/L (ref 96–112)
Creatinine, Ser: 0.61 mg/dL (ref 0.40–1.20)
GFR: 116.95 mL/min (ref >60.00)
Glucose, Bld: 202 mg/dL — ABNORMAL HIGH (ref 70–99)
Potassium: 4.2 mEq/L (ref 3.5–5.1)
Sodium: 134 mEq/L — ABNORMAL LOW (ref 135–145)

## 2021-05-07 LAB — HEMOGLOBIN A1C: Hgb A1c MFr Bld: 8.8 % — ABNORMAL HIGH (ref 4.6–6.5)

## 2021-05-07 MED ORDER — OZEMPIC (0.25 OR 0.5 MG/DOSE) 2 MG/1.5ML ~~LOC~~ SOPN: PEN_INJECTOR | SUBCUTANEOUS | 0 refills | Status: DC

## 2021-05-07 NOTE — Patient Instructions (Signed)
Go to lab for blood draw  Schedule appt with GYN  Increase ozempic dose to 0.5mg  weekly.  Need to check glucose every morning 3x/week and record. Bring reading or glucometer to your next office visit.

## 2021-05-07 NOTE — Progress Notes (Signed)
Subjective:  Patient ID: Tricia Benitez, female    DOB: 1985/10/12  Age: 35 y.o. MRN: [id number]  CC: Follow-up (F/u on medications and in need of blood work. Pt is not fasting. /Declines all vaccines today. )  HPI  Type 2 diabetes mellitus with hyperglycemia, without long-term current use of insulin (HCC) Unable to tolerate actos: caused arm pain after 3doses, resolved with discontinuation of med. Current use of ozempic 0.34m weekly, no adverse side effects, did not titrate dose up as directed. Does not have a specific reason. Checked glucose twice since last appt: 140 and 170 Exercise: walking 344ms daily Diet: made no changes.  Increase ozempic dose to 0.30m330meekly Check hgbA1c, BMP, LDL and hepatic panel F/up in 70mo84monthised about importance of diet and medication compliance Advised to check glucose 3x/week in AM  BP Readings from Last 3 Encounters:  05/07/21 120/68  01/04/21 118/80  07/24/20 130/80    Wt Readings from Last 3 Encounters:  05/07/21 175 lb (79.4 kg)  01/04/21 172 lb 9.6 oz (78.3 kg)  07/24/20 172 lb 12.8 oz (78.4 kg)     Reviewed past Medical, Social and Family history today.  Outpatient Medications Prior to Visit  Medication Sig Dispense Refill   ACCU-CHEK GUIDE test strip TEST BLOOD SUGAR ONCE DAILY BEFORE BREAKFAST 100 strip 3   Accu-Chek Softclix Lancets lancets every morning.     albuterol (VENTOLIN HFA) 108 (90 Base) MCG/ACT inhaler Inhale 2 puffs into the lungs every 6 (six) hours as needed for wheezing or shortness of breath. 1 Inhaler 2   blood glucose meter kit and supplies KIT Dispense based on patient and insurance preference. Use once a day before breakfast. ICD 10: E11.65 1 each 0   calcium carbonate (TUMS - DOSED IN MG ELEMENTAL CALCIUM) 500 MG chewable tablet Chew 1 tablet by mouth daily.     Cetirizine HCl 10 MG TBDP Take 10 mg by mouth daily.     ferrous sulfate 325 (65 FE) MG tablet TAKE 1 TABLET (325 MG TOTAL) BY MOUTH DAILY  AFTER BREAKFAST. 90 tablet 1   Omega-3 Fatty Acids (FISH OIL PO) Take by mouth.     Semaglutide,0.25 or 0.5MG/DOS, (OZEMPIC, 0.25 OR 0.5 MG/DOSE,) 2 MG/1.5ML SOPN 0.230mg630mkly x 2weeks, then 0.5weekly continuously 1.5 mL 2   pioglitazone (ACTOS) 15 MG tablet Take 1 tablet (15 mg total) by mouth daily. (Patient not taking: Reported on 05/07/2021) 30 tablet 5   No facility-administered medications prior to visit.    ROS See HPI  Objective:  BP 120/68 (BP Location: Left Arm, Patient Position: Sitting, Cuff Size: Normal)   Pulse 78   Temp (!) 97 F (36.1 C) (Temporal)   Ht 5' 5"  (1.651 m)   Wt 175 lb (79.4 kg)   SpO2 98%   BMI 29.12 kg/m   Physical Exam Constitutional:      Appearance: She is obese.  Cardiovascular:     Rate and Rhythm: Normal rate and regular rhythm.     Pulses: Normal pulses.     Heart sounds: Normal heart sounds.  Pulmonary:     Effort: Pulmonary effort is normal.     Breath sounds: Normal breath sounds.  Musculoskeletal:     Right lower leg: No edema.     Left lower leg: No edema.  Neurological:     Mental Status: She is alert and oriented to person, place, and time.  Psychiatric:        Mood and Affect: Mood  normal.        Behavior: Behavior normal.        Thought Content: Thought content normal.   Assessment & Plan:  This visit occurred during the SARS-CoV-2 public health emergency.  Safety protocols were in place, including screening questions prior to the visit, additional usage of staff PPE, and extensive cleaning of exam room while observing appropriate contact time as indicated for disinfecting solutions.   Toone was seen today for follow-up.  Diagnoses and all orders for this visit:  Type 2 diabetes mellitus with hyperglycemia, without long-term current use of insulin (HCC) -     Semaglutide,0.25 or 0.5MG/DOS, (OZEMPIC, 0.25 OR 0.5 MG/DOSE,) 2 MG/1.5ML SOPN; 0.60m weekly -     Hemoglobin A1c -     Basic metabolic panel -     Hepatic  function panel -     Direct LDL  Problem List Items Addressed This Visit       Endocrine   Type 2 diabetes mellitus with hyperglycemia, without long-term current use of insulin ([place]    Unable to tolerate actos: caused arm pain after 3doses, resolved with discontinuation of med. Current use of ozempic 0.234mweekly, no adverse side effects, did not titrate dose up as directed. Does not have a specific reason. Checked glucose twice since last appt: 140 and 170 Exercise: walking 3045m daily Diet: made no changes.  Increase ozempic dose to 0.5mg68mekly Check hgbA1c, BMP, LDL and hepatic panel F/up in 1mon35monthsed about importance of diet and medication compliance Advised to check glucose 3x/week in AM       Relevant Medications   Semaglutide,0.25 or 0.5MG/DOS, (OZEMPIC, 0.25 OR 0.5 MG/DOSE,) 2 MG/1.5ML SOPN   Other Relevant Orders   Hemoglobin A1c  D0Ssic metabolic panel   Hepatic function panel   Direct LDL    Follow-up: Return in about 4 weeks (around 06/04/2021) for DM.  CharlWilfred Lacy

## 2021-05-07 NOTE — Assessment & Plan Note (Signed)
Unable to tolerate actos: caused arm pain after 3doses, resolved with discontinuation of med. Current use of ozempic 0.25mg  weekly, no adverse side effects, did not titrate dose up as directed. Does not have a specific reason. Checked glucose twice since last appt: 140 and 170 Exercise: walking 57mins daily Diet: made no changes.  Increase ozempic dose to 0.5mg  weekly Check hgbA1c, BMP, LDL and hepatic panel F/up in 44month Advised about importance of diet and medication compliance Advised to check glucose 3x/week in AM

## 2021-05-30 ENCOUNTER — Ambulatory Visit: Payer: BC Managed Care – PPO | Admitting: Nurse Practitioner

## 2021-09-18 ENCOUNTER — Telehealth: Payer: Self-pay | Admitting: Nurse Practitioner

## 2021-09-18 NOTE — Telephone Encounter (Signed)
erro

## 2021-10-19 ENCOUNTER — Encounter (HOSPITAL_COMMUNITY): Payer: Self-pay | Admitting: Emergency Medicine

## 2021-10-19 ENCOUNTER — Ambulatory Visit (HOSPITAL_COMMUNITY)
Admission: EM | Admit: 2021-10-19 | Discharge: 2021-10-19 | Disposition: A | Discharge status: Home / Self Care | Payer: BC Managed Care – PPO | Attending: Family Medicine | Admitting: Family Medicine

## 2021-10-19 ENCOUNTER — Ambulatory Visit (INDEPENDENT_AMBULATORY_CARE_PROVIDER_SITE_OTHER): Payer: BC Managed Care – PPO

## 2021-10-19 DIAGNOSIS — R052 Subacute cough: Secondary | ICD-10-CM

## 2021-10-19 DIAGNOSIS — R059 Cough, unspecified: Secondary | ICD-10-CM

## 2021-10-19 DIAGNOSIS — J4521 Mild intermittent asthma with (acute) exacerbation: Secondary | ICD-10-CM | POA: Diagnosis not present

## 2021-10-19 DIAGNOSIS — R0602 Shortness of breath: Secondary | ICD-10-CM | POA: Diagnosis not present

## 2021-10-19 MED ORDER — PREDNISONE 50 MG PO TABS: 50.0000 mg | ORAL_TABLET | Freq: Every day | ORAL | 0 refills | Status: AC

## 2021-10-19 NOTE — Discharge Instructions (Signed)
You were seen today for cough and shortness of breath.  I think this is due to an asthma exacerbation.  I think you can stop the zpack.  ?I have sent out prednisone to take daily x 5 days.  You may continue your inhaler as well.  You may use over the counter mucinex to help with any cough and congestion.  ?Please return if you have worsening symptoms, or not improving as expected.  ? ?

## 2021-10-19 NOTE — ED Triage Notes (Signed)
Patient c/o nonproductive cough, SOB upon exertion,  and nasal congestion x 1 week.  ? ?Patient endorses chest pain with inhalation.  ? ?Patient endorses wheezing at times.  ? ?Patient has used Albuterol inhaler with no relief of symptoms.  ? ?History of Asthma.  ? ?Patient was seen at another clinic and tested negative for COVID and Flu per patient statement. Patient was given Azithromycin, started first dose yesterday. ?

## 2021-10-19 NOTE — ED Provider Notes (Signed)
?[facility] ? ? ? ?CSN: [id number] ?Arrival date & time: 10/19/21  1056 ? ? ?  ? ?History   ?Chief Complaint ?Chief Complaint  ?Patient presents with  ? Cough  ? Shortness of Breath  ? Nasal Congestion  ? ? ?HPI ?Tricia Benitez is a 36 y.o. female.  ? ?Patient is here for uri symptoms.  ?Started last weekend with sinus congestion, drainage and cough.  No fever.  The sinus drainage has improved, but she continued with cough, along with sob.  She does have asthma.  ?She went to the [facility], had flu and covid test (both negative) and sent home with a zpack (without any exam).  ?She started the zpack x 1 dose last night.  ?Today she was more sob and came here.  ?Cough is now dry;  using her albuterol inhaler much more than she should without much help.  ?Chest pain with inspiration.  ? ?Past Medical History:  ?Diagnosis Date  ? Anemia   ? Asthma   ? Diabetes mellitus without complication ([place])   ? Type II  ? UTI (urinary tract infection)   ? ? ?Patient Active Problem List  ? Diagnosis Date Noted  ? Type 2 diabetes mellitus with hyperglycemia, without long-term current use of insulin ([place]) 07/24/2020  ? Iron deficiency anemia due to chronic blood loss 07/24/2020  ? Intra-abdominal fluid collection 11/04/2018  ? Left breast mass 04/23/2018  ? ? ?Past Surgical History:  ?Procedure Laterality Date  ? BREAST BIOPSY Left 2015  ? cyst removed.   ? MOUTH SURGERY    ? teeth ext   ? MYOMECTOMY N/A 09/01/2018  ? Procedure: ABDOMINAL MYOMECTOMY;  Surgeon: Chancy Milroy, MD;  Location: [facility];  Service: Gynecology;  Laterality: N/A;  ? TONSILLECTOMY    ? ? ?OB History   ? ? Gravida  ?1  ? Para  ?   ? Term  ?   ? Preterm  ?   ? AB  ?1  ? Living  ?   ?  ? ? SAB  ?   ? IAB  ?1  ? Ectopic  ?   ? Multiple  ?   ? Live Births  ?   ?   ?  ?  ? ? ? ?Home Medications   ? ?Prior to Admission medications   ?Medication Sig Start Date End Date Taking? Authorizing Provider  ?albuterol (VENTOLIN HFA) 108 (90 Base) MCG/ACT inhaler  Inhale 2 puffs into the lungs every 6 (six) hours as needed for wheezing or shortness of breath. 11/08/18  Yes Chancy Milroy, MD  ?calcium carbonate (TUMS - DOSED IN MG ELEMENTAL CALCIUM) 500 MG chewable tablet Chew 1 tablet by mouth daily.   Yes [provider]  ?Cetirizine HCl 10 MG TBDP Take 10 mg by mouth daily.   Yes [provider]  ?ferrous sulfate 325 (65 FE) MG tablet TAKE 1 TABLET (325 MG TOTAL) BY MOUTH DAILY AFTER BREAKFAST. 01/23/21  Yes Cirigliano, Garvin Fila, DO  ?Omega-3 Fatty Acids (FISH OIL PO) Take by mouth.   Yes [provider]  ?Semaglutide,0.25 or 0.5MG/DOS, (OZEMPIC, 0.25 OR 0.5 MG/DOSE,) 2 MG/1.5ML SOPN 0.55m weekly 05/07/21  Yes Nche, CCharlene Brooke NP  ?ACCU-CHEK GUIDE test strip TEST BLOOD SUGAR ONCE DAILY BEFORE BREAKFAST 10/24/20   Nche, CCharlene Brooke NP  ?Accu-Chek Softclix Lancets lancets every morning. 07/26/20   [provider]  ?blood glucose meter kit and supplies KIT Dispense based on patient and  insurance preference. Use once a day before breakfast. ICD 10: E11.65 07/26/20   Nche, Charlene Brooke, NP  ?Ferrous Sulfate (IRON PO) Take by mouth.  06/28/19  [provider]  ? ? ?Family History ?Family History  ?Problem Relation Age of Onset  ? Fibroids Mother   ? Asthma Mother   ? Diabetes Father   ? Kidney disease Father   ? Alcohol abuse Father   ? Diabetes Paternal Grandmother   ? Hyperlipidemia Paternal Grandmother   ? Hypertension Paternal Grandmother   ? Alcohol abuse Maternal Grandmother   ? ? ?Social History ?Social History  ? ?Tobacco Use  ? Smoking status: Never  ? Smokeless tobacco: Never  ?Vaping Use  ? Vaping Use: Never used  ?Substance Use Topics  ? Alcohol use: Yes  ?  Comment: social  ? Drug use: Never  ? ? ? ?Allergies   ?Penicillins, Amoxicillin, and Glipizide ? ? ?Review of Systems ?Review of Systems  ?Constitutional:  Positive for fatigue. Negative for chills and fever.  ?HENT:  Positive for congestion and rhinorrhea. Negative for  sore throat.   ?Eyes: Negative.   ?Respiratory:  Positive for cough and shortness of breath.   ?Cardiovascular: Negative.   ?Gastrointestinal: Negative.   ?Genitourinary: Negative.   ?Musculoskeletal: Negative.   ? ? ?Physical Exam ?Triage Vital Signs ?ED Triage Vitals [10/19/21 1143]  ?Enc Vitals Group  ?   BP (!) 147/90  ?   Pulse Rate 82  ?   Resp 18  ?   Temp 98.3 ?F (36.8 ?C)  ?   Temp Source Oral  ?   SpO2 97 %  ?   Weight   ?   Height   ?   Head Circumference   ?   Peak Flow   ?   Pain Score 0  ?   Pain Loc   ?   Pain Edu?   ?   Excl. in [place]?   ? ?No data found. ? ?Updated Vital Signs ?BP (!) 147/90 (BP Location: Right Arm)   Pulse 82   Temp 98.3 ?F (36.8 ?C) (Oral)   Resp 18   LMP 10/12/2021 (Exact Date)   SpO2 97%  ? ?Visual Acuity ?Right Eye Distance:   ?Left Eye Distance:   ?Bilateral Distance:   ? ?Right Eye Near:   ?Left Eye Near:    ?Bilateral Near:    ? ?Physical Exam ?Constitutional:   ?   Appearance: She is well-developed.  ?HENT:  ?   Head: Normocephalic and atraumatic.  ?Eyes:  ?   Pupils: Pupils are equal, round, and reactive to light.  ?Cardiovascular:  ?   Rate and Rhythm: Normal rate and regular rhythm.  ?Pulmonary:  ?   Effort: Pulmonary effort is normal. No respiratory distress.  ?   Breath sounds: Normal breath sounds. No wheezing, rhonchi or rales.  ?Musculoskeletal:  ?   Cervical back: Normal range of motion and neck supple.  ?Neurological:  ?   Mental Status: She is alert.  ? ? ? ?UC Treatments / Results  ?Labs ?(all labs ordered are listed, but only abnormal results are displayed) ?Labs Reviewed - No data to display ? ?EKG ? ? ?Radiology ?DG Chest 2 View ? ?Result Date: 10/19/2021 ?CLINICAL DATA:  Cough and shortness of breath. EXAM: CHEST - 2 VIEW COMPARISON:  Chest two views 04/16/2019 FINDINGS: Cardiac silhouette and mediastinal contours are within normal limits. The lungs are clear. No pleural effusion or pneumothorax. No acute  skeletal abnormality. IMPRESSION: No active  cardiopulmonary disease. Electronically Signed   By: Yvonne Kendall M.D.   On: 10/19/2021 12:08   ? ?Procedures ?Procedures (including critical care time) ? ?Medications Ordered in UC ?Medications - No data to display ? ?Initial Impression / Assessment and Plan / UC Course  ?I have reviewed the triage vital signs and the nursing notes. ? ?Pertinent labs & imaging results that were available during my care of the patient were reviewed by me and considered in my medical decision making (see chart for details). ? ?  ? ?Final Clinical Impressions(s) / UC Diagnoses  ? ?Final diagnoses:  ?Mild intermittent asthma with acute exacerbation  ?Subacute cough  ?Shortness of breath  ? ? ? ?Discharge Instructions   ? ?  ?You were seen today for cough and shortness of breath.  I think this is due to an asthma exacerbation.  I think you can stop the zpack.  ?I have sent out prednisone to take daily x 5 days.  You may continue your inhaler as well.  You may use over the counter mucinex to help with any cough and congestion.  ?Please return if you have worsening symptoms, or not improving as expected.  ? ? ? ? ?ED Prescriptions   ? ? Medication Sig Dispense Auth. Provider  ? predniSONE (DELTASONE) 50 MG tablet Take 1 tablet (50 mg total) by mouth daily with breakfast for 5 days. 5 tablet Rondel Oh, MD  ? ?  ? ?PDMP not reviewed this encounter. ?  Rondel Oh, MD ?10/19/21 1220 ? ?

## 2021-10-25 ENCOUNTER — Other Ambulatory Visit: Payer: Self-pay | Admitting: Nurse Practitioner

## 2022-08-07 ENCOUNTER — Emergency Department (HOSPITAL_COMMUNITY)
Admission: EM | Admit: 2022-08-07 | Discharge: 2022-08-07 | Disposition: A | Discharge status: Home / Self Care | Payer: BC Managed Care – PPO | Attending: Emergency Medicine | Admitting: Emergency Medicine

## 2022-08-07 ENCOUNTER — Other Ambulatory Visit: Payer: Self-pay

## 2022-08-07 ENCOUNTER — Encounter (HOSPITAL_COMMUNITY): Payer: Self-pay | Admitting: *Deleted

## 2022-08-07 DIAGNOSIS — M25512 Pain in left shoulder: Secondary | ICD-10-CM | POA: Insufficient documentation

## 2022-08-07 DIAGNOSIS — X500XXA Overexertion from strenuous movement or load, initial encounter: Secondary | ICD-10-CM | POA: Diagnosis not present

## 2022-08-07 DIAGNOSIS — G8911 Acute pain due to trauma: Secondary | ICD-10-CM | POA: Diagnosis not present

## 2022-08-07 DIAGNOSIS — Y990 Civilian activity done for income or pay: Secondary | ICD-10-CM | POA: Diagnosis not present

## 2022-08-07 MED ORDER — METHOCARBAMOL 500 MG PO TABS: 500.0000 mg | ORAL_TABLET | Freq: Two times a day (BID) | ORAL | 0 refills | Status: DC

## 2022-08-07 MED ORDER — IBUPROFEN 600 MG PO TABS: 600.0000 mg | ORAL_TABLET | Freq: Four times a day (QID) | ORAL | 0 refills | Status: AC | PRN

## 2022-08-07 NOTE — ED Triage Notes (Signed)
The pt is c/o pain in her lt shoulder since yesterday worse for the past 2 hours no known injury lmp last week

## 2022-08-07 NOTE — Discharge Instructions (Signed)

## 2022-08-07 NOTE — ED Provider Notes (Signed)
[facility] EMERGENCY DEPARTMENT Provider Note   CSN: [id number] Arrival date & time: 08/07/22  2209     History  Chief Complaint  Patient presents with   Shoulder Pain    Tricia Benitez is a 37 y.o. female.   Shoulder Pain L shoulder pain since yesterday.  She has not had any injuries but does state that she does do some heavy lifting with work.  She denies any chest pain difficulty breathing no lightheadedness or dizziness no nausea or shortness of breath.  She states that hurts when she lifts her arm overhead.  No medications tried prior to arrival.       Home Medications Prior to Admission medications   Medication Sig Start Date End Date Taking? Authorizing Provider  ibuprofen (ADVIL) 600 MG tablet Take 1 tablet (600 mg total) by mouth every 6 (six) hours as needed. 08/07/22  Yes Menaal Russum S, PA  methocarbamol (ROBAXIN) 500 MG tablet Take 1 tablet (500 mg total) by mouth 2 (two) times daily. 08/07/22  Yes Avalee Castrellon, Ova Freshwater S, PA  ACCU-CHEK GUIDE test strip TEST BLOOD SUGAR ONCE DAILY BEFORE BREAKFAST 10/31/21   Nche, Charlene Brooke, NP  Accu-Chek Softclix Lancets lancets every morning. 07/26/20   [provider]  albuterol (VENTOLIN HFA) 108 (90 Base) MCG/ACT inhaler Inhale 2 puffs into the lungs every 6 (six) hours as needed for wheezing or shortness of breath. 11/08/18   Chancy Milroy, MD  blood glucose meter kit and supplies KIT Dispense based on patient and insurance preference. Use once a day before breakfast. ICD 10: E11.65 07/26/20   Nche, Charlene Brooke, NP  calcium carbonate (TUMS - DOSED IN MG ELEMENTAL CALCIUM) 500 MG chewable tablet Chew 1 tablet by mouth daily.    [provider]  Cetirizine HCl 10 MG TBDP Take 10 mg by mouth daily.    [provider]  ferrous sulfate 325 (65 FE) MG tablet TAKE 1 TABLET (325 MG TOTAL) BY MOUTH DAILY AFTER BREAKFAST. 01/23/21   Cirigliano, Mary K, DO  Omega-3 Fatty Acids (FISH OIL PO) Take  by mouth.    [provider]  Semaglutide,0.25 or 0.'5MG'$ /DOS, (OZEMPIC, 0.25 OR 0.5 MG/DOSE,) 2 MG/1.5ML SOPN 0.'5mg'$  weekly 05/07/21   Nche, Charlene Brooke, NP  Ferrous Sulfate (IRON PO) Take by mouth.  06/28/19  [provider]      Allergies    Penicillins, Amoxicillin, and Glipizide    Review of Systems   Review of Systems  Physical Exam Updated Vital Signs BP (!) 161/87   Pulse 71   Temp 97.6 F (36.4 C) (Oral)   Resp 18   Ht '5\' 5"'$  (1.651 m)   Wt 79.4 kg   LMP 07/03/2022   SpO2 100%   BMI 29.13 kg/m  Physical Exam Vitals and nursing note reviewed.  Constitutional:      General: She is not in acute distress.    Appearance: Normal appearance. She is not ill-appearing.  HENT:     Head: Normocephalic and atraumatic.  Eyes:     General: No scleral icterus.       Right eye: No discharge.        Left eye: No discharge.     Conjunctiva/sclera: Conjunctivae normal.  Pulmonary:     Effort: Pulmonary effort is normal.     Breath sounds: No stridor.  Musculoskeletal:     Comments: Discomfort with range of motion of left shoulder but range of motion is nearly normal.  Bilateral  radial artery pulses 2+ and symmetric, some deltoid tenderness with palpation.  No pain with internal/external rotation at shoulder.  Grip strength bilaterally 5/5  Neurological:     Mental Status: She is alert and oriented to person, place, and time. Mental status is at baseline.     ED Results / Procedures / Treatments   Labs (all labs ordered are listed, but only abnormal results are displayed) Labs Reviewed - No data to display  EKG None  Radiology No results found.  Procedures Procedures    Medications Ordered in ED Medications - No data to display  ED Course/ Medical Decision Making/ A&P                             Medical Decision Making Risk Prescription drug management.   L shoulder pain since yesterday.  She has not had any injuries but does state that she  does do some heavy lifting with work.  She denies any chest pain difficulty breathing no lightheadedness or dizziness no nausea or shortness of breath.  She states that hurts when she lifts her arm overhead.  No medications tried prior to arrival.     Physical exam notable for pain with range of motion of left shoulder.  No trauma.  Offered x-ray which she does not feel is necessary and I agree with this.  She does not clinically appear to be dislocated or fractured.   Ortho FU given.  Robaxin/insaids   Final Clinical Impression(s) / ED Diagnoses Final diagnoses:  Acute pain of left shoulder    Rx / DC Orders ED Discharge Orders          Ordered    methocarbamol (ROBAXIN) 500 MG tablet  2 times daily        08/07/22 2324    ibuprofen (ADVIL) 600 MG tablet  Every 6 hours PRN        08/07/22 2327              Tedd Sias, [place] 08/08/22 0422    Merrily Pew, MD 08/08/22 [phone]

## 2022-10-07 ENCOUNTER — Other Ambulatory Visit: Payer: Self-pay | Admitting: Obstetrics and Gynecology

## 2022-10-07 DIAGNOSIS — N6310 Unspecified lump in the right breast, unspecified quadrant: Secondary | ICD-10-CM

## 2022-11-25 ENCOUNTER — Ambulatory Visit: Admission: RE | Admit: 2022-11-25 | Payer: BC Managed Care – PPO | Source: Ambulatory Visit

## 2022-11-25 ENCOUNTER — Ambulatory Visit
Admission: RE | Admit: 2022-11-25 | Discharge: 2022-11-25 | Disposition: A | Discharge status: Home / Self Care | Payer: BC Managed Care – PPO | Source: Ambulatory Visit | Attending: Obstetrics and Gynecology | Admitting: Obstetrics and Gynecology

## 2022-11-25 ENCOUNTER — Ambulatory Visit: Payer: BC Managed Care – PPO

## 2022-11-25 DIAGNOSIS — N6310 Unspecified lump in the right breast, unspecified quadrant: Secondary | ICD-10-CM

## 2023-09-24 IMAGING — DX DG CHEST 2V
2 series · 2 of 2 positions shown · non-contrast
Comparison: Chest two views 04/16/2019

CLINICAL DATA: Cough and shortness of breath.

EXAM:
CHEST - 2 VIEW

[chest pa]
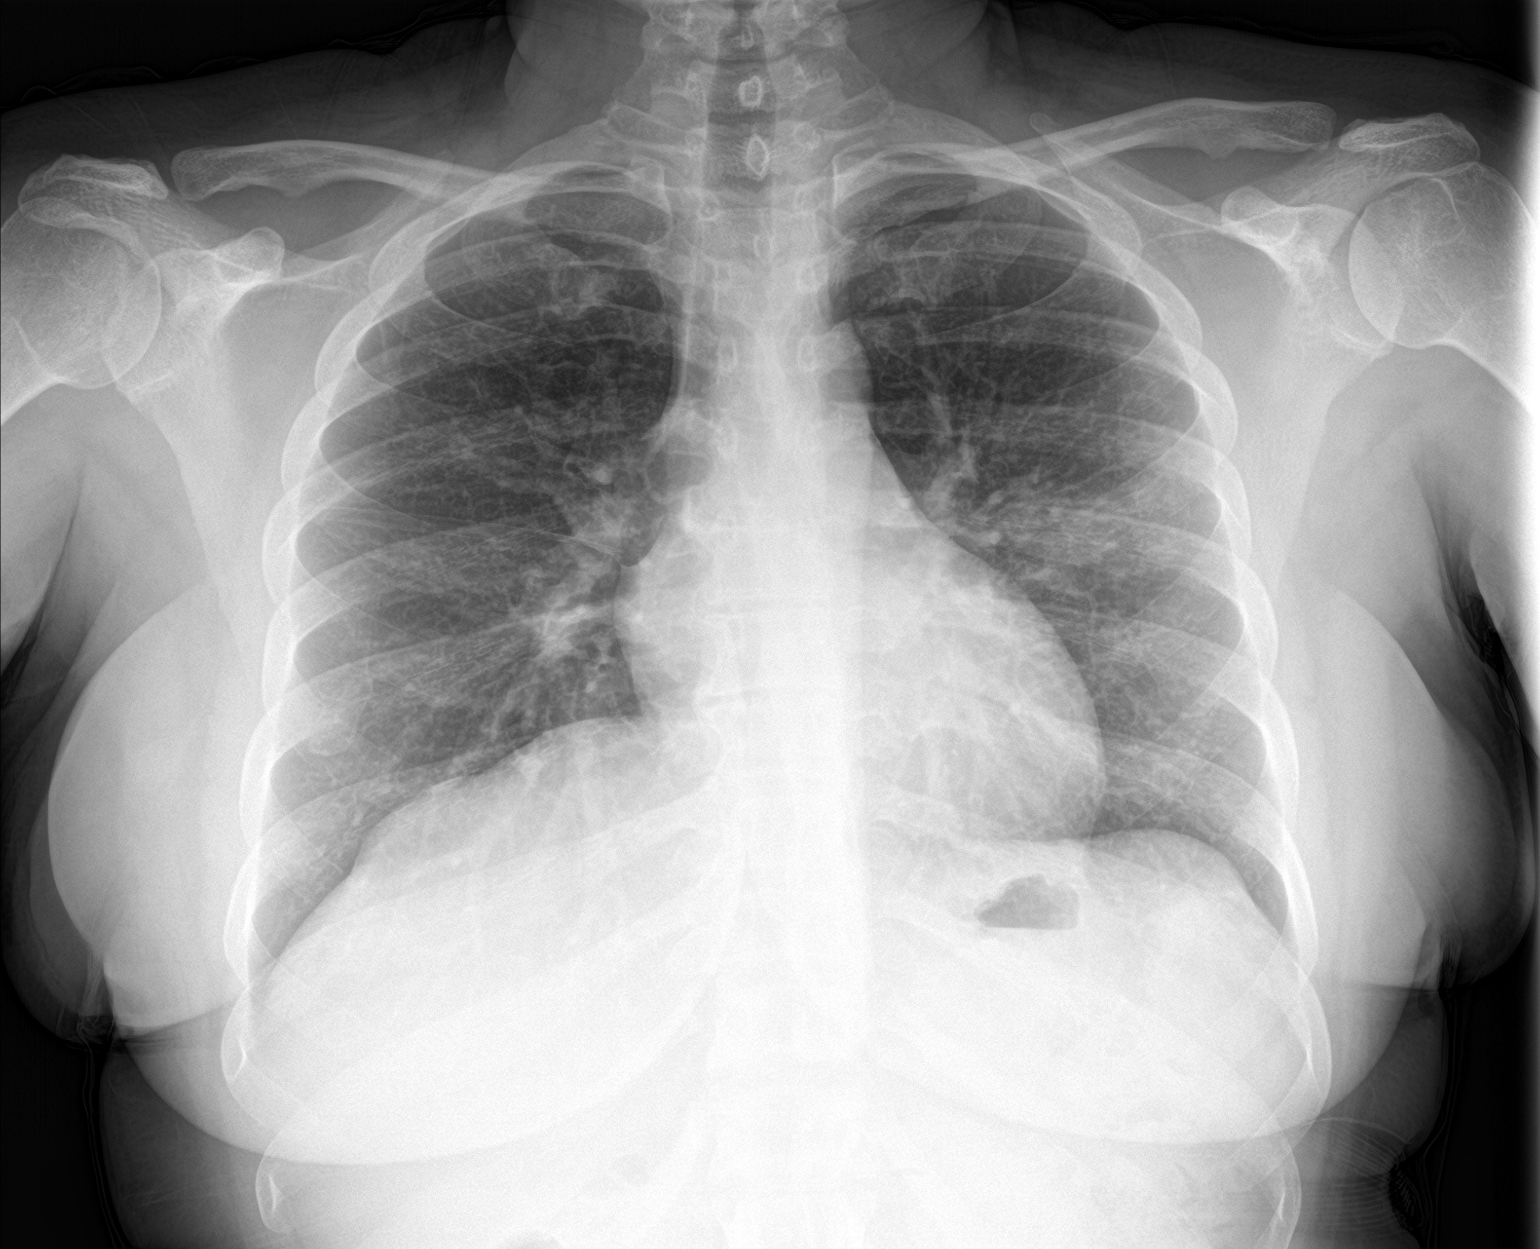

[chest lat]
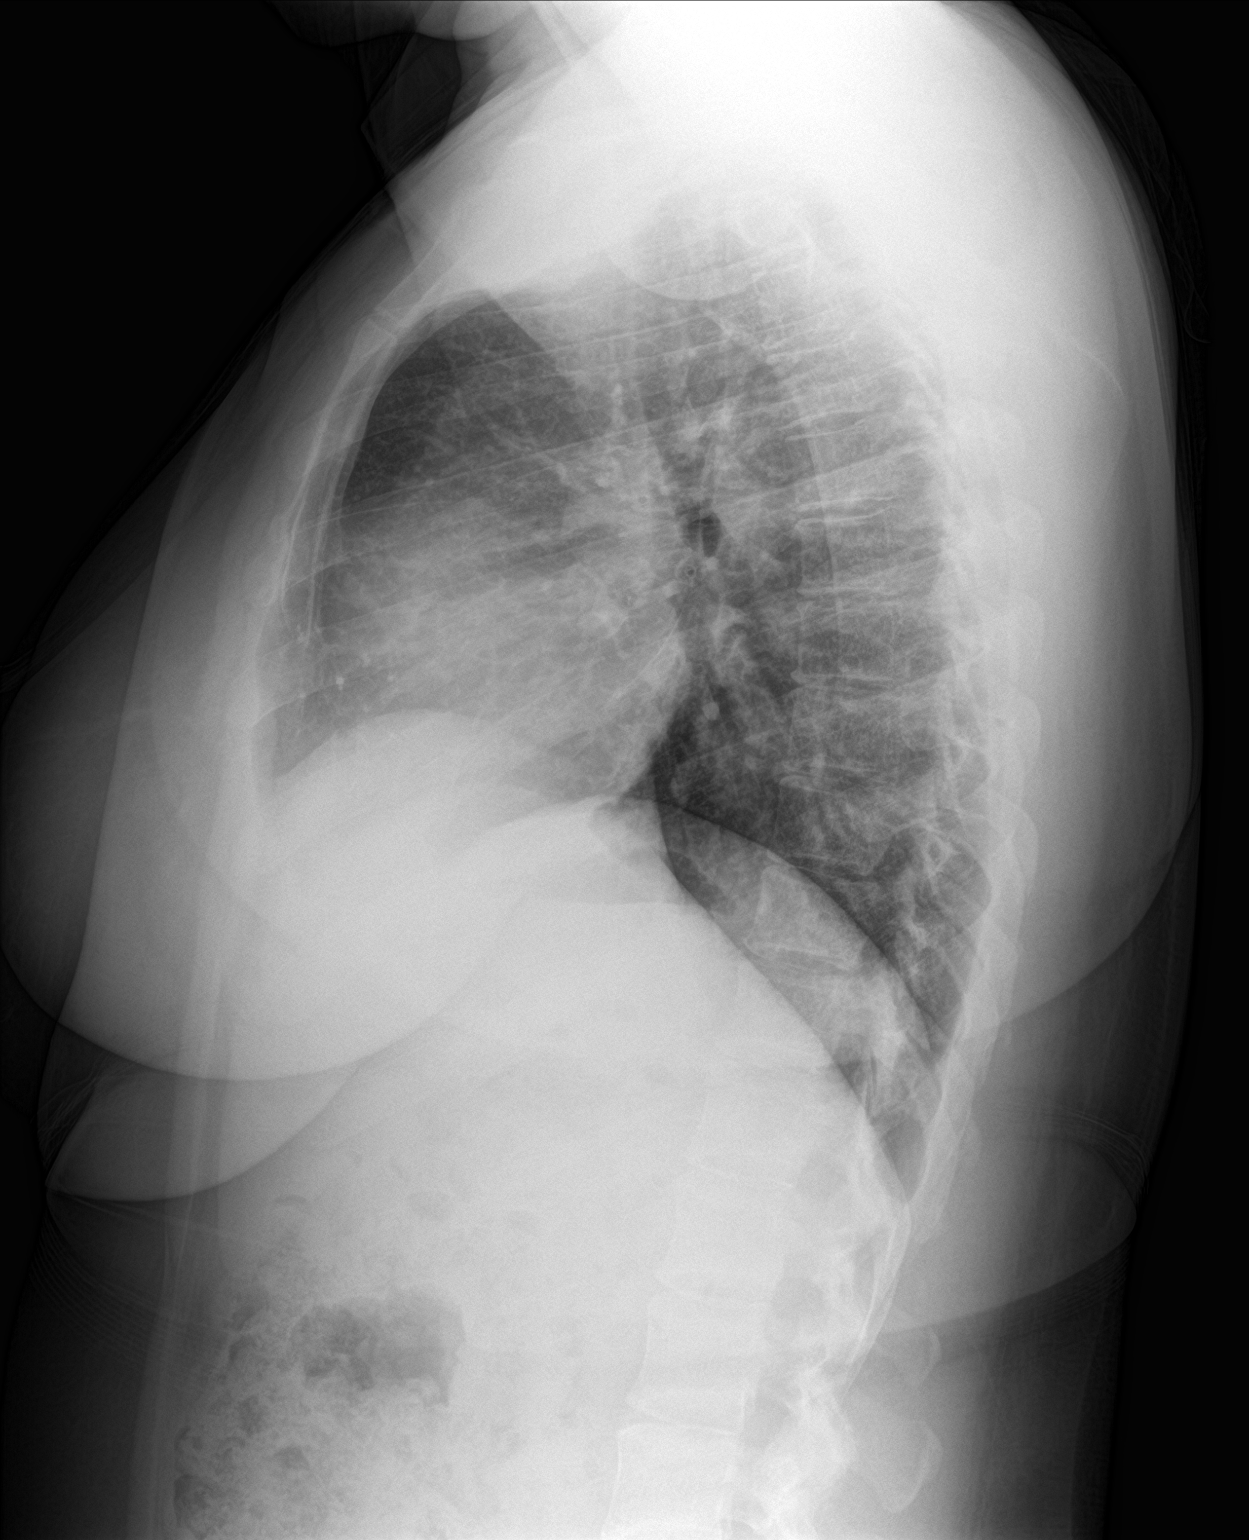

[2 of 2 positions shown; findings below may reference images not displayed]

FINDINGS: Cardiac silhouette and mediastinal contours are within normal
limits. The lungs are clear. No pleural effusion or pneumothorax. No
acute skeletal abnormality.
IMPRESSION: No active cardiopulmonary disease.

## 2023-10-02 ENCOUNTER — Other Ambulatory Visit: Payer: Self-pay | Admitting: Obstetrics and Gynecology

## 2023-10-02 DIAGNOSIS — N6310 Unspecified lump in the right breast, unspecified quadrant: Secondary | ICD-10-CM

## 2023-10-20 ENCOUNTER — Telehealth: Payer: Self-pay | Admitting: Nurse Practitioner

## 2023-10-20 NOTE — Telephone Encounter (Signed)
 Lvmtcb to schedule appt if needed.

## 2024-06-07 ENCOUNTER — Emergency Department (HOSPITAL_COMMUNITY)
Admission: EM | Admit: 2024-06-07 | Discharge: 2024-06-07 | Disposition: A | Discharge status: Home / Self Care | Attending: Emergency Medicine | Admitting: Emergency Medicine

## 2024-06-07 DIAGNOSIS — J45909 Unspecified asthma, uncomplicated: Secondary | ICD-10-CM | POA: Insufficient documentation

## 2024-06-07 DIAGNOSIS — F32A Depression, unspecified: Secondary | ICD-10-CM | POA: Insufficient documentation

## 2024-06-07 DIAGNOSIS — F4322 Adjustment disorder with anxiety: Secondary | ICD-10-CM | POA: Diagnosis not present

## 2024-06-07 DIAGNOSIS — E119 Type 2 diabetes mellitus without complications: Secondary | ICD-10-CM | POA: Diagnosis not present

## 2024-06-07 DIAGNOSIS — R531 Weakness: Secondary | ICD-10-CM | POA: Diagnosis not present

## 2024-06-07 DIAGNOSIS — F419 Anxiety disorder, unspecified: Secondary | ICD-10-CM | POA: Diagnosis present

## 2024-06-07 LAB — CBC
HCT: 39.8 % (ref 36.0–46.0)
Hemoglobin: 12.3 g/dL (ref 12.0–15.0)
MCH: 25.3 pg — ABNORMAL LOW (ref 26.0–34.0)
MCHC: 30.9 g/dL (ref 30.0–36.0)
MCV: 81.7 fL (ref 80.0–100.0)
Platelets: 281 K/uL (ref 150–400)
RBC: 4.87 MIL/uL (ref 3.87–5.11)
RDW: 14.1 % (ref 11.5–15.5)
WBC: 4 K/uL (ref 4.0–10.5)
nRBC: 0 % (ref 0.0–0.2)

## 2024-06-07 LAB — BASIC METABOLIC PANEL WITH GFR
Anion gap: 9 (ref 5–15)
BUN: 9 mg/dL (ref 6–20)
CO2: 26 mmol/L (ref 22–32)
Calcium: 9.5 mg/dL (ref 8.9–10.3)
Chloride: 105 mmol/L (ref 98–111)
Creatinine, Ser: 0.6 mg/dL (ref 0.44–1.00)
GFR, Estimated: >60 mL/min (ref >60)
Glucose, Bld: 166 mg/dL — ABNORMAL HIGH (ref 70–99)
Potassium: 3.9 mmol/L (ref 3.5–5.1)
Sodium: 139 mmol/L (ref 135–145)

## 2024-06-07 LAB — CBG MONITORING, ED: Glucose-Capillary: 132 mg/dL — ABNORMAL HIGH (ref 70–99)

## 2024-06-07 LAB — CK: Total CK: 83 U/L (ref 38–234)

## 2024-06-07 NOTE — Discharge Instructions (Addendum)
 Your workup in the ED today was reassuring.  We recommend follow-up with your primary care doctor.  You may return for new or concerning symptoms.

## 2024-06-07 NOTE — ED Triage Notes (Signed)
 Pt states that before bed last night she began feeling tremulous in her hands and legs. Pt states that this brought on anxiety, which she was able to calm herself down with. Pt states that throughout the night she has continued to feel tremors though none visible during triage. Denies hx of alcohol/drug use, no CP, palpitations, SOB.

## 2024-06-07 NOTE — ED Provider Notes (Signed)
 [facility] EMERGENCY DEPARTMENT AT [facility] Provider Note   CSN: [id number] Arrival date & time: 06/07/24  9557     Patient presents with: Anxiety   Tricia Benitez is a 38 y.o. female.   38 year old female with a history of asthma, diabetes, anemia presents to the emergency department for generalized weakness.  Reports feeling tremulous in her hands and legs before bed last night.  She was able to sleep from 2230-0330.  Upon waking, noticed worsening anxiety and tremulousness.  This escalated to a panic attack.  Patient was able to calm herself down, but continues to feel generally weak.  Patient reports onset of anxiety after suffering complications after a myomectomy.  She had previously never experienced panic attacks or tremors.  However, patient states she has never experienced a sensation of generalized weakness with her anxiety episodes making her more concerned about her symptoms tonight.  She denies alcohol and illicit drug use as well as chest pain, shortness of breath, and syncope or near syncope.  The history is provided by the patient. No language interpreter was used.  Anxiety       Prior to Admission medications   Medication Sig Start Date End Date Taking? Authorizing Provider  ACCU-CHEK GUIDE test strip TEST BLOOD SUGAR ONCE DAILY BEFORE BREAKFAST 10/31/21   Nche, Roselie Rockford, NP  Accu-Chek Softclix Lancets lancets every morning. 07/26/20   [provider]  albuterol  (VENTOLIN  HFA) 108 (90 Base) MCG/ACT inhaler Inhale 2 puffs into the lungs every 6 (six) hours as needed for wheezing or shortness of breath. 11/08/18   Ervin, Michael L, MD  blood glucose meter kit and supplies KIT Dispense based on patient and insurance preference. Use once a day before breakfast. ICD 10: E11.65 07/26/20   Nche, Roselie Rockford, NP  calcium carbonate (TUMS - DOSED IN MG ELEMENTAL CALCIUM) 500 MG chewable tablet Chew 1 tablet by mouth daily.    [provider]  Cetirizine HCl 10 MG TBDP Take 10 mg by mouth daily.    [provider]  ferrous sulfate 325 (65 FE) MG tablet TAKE 1 TABLET (325 MG TOTAL) BY MOUTH DAILY AFTER BREAKFAST. 01/23/21   Cirigliano, Mary K, DO  ibuprofen  (ADVIL ) 600 MG tablet Take 1 tablet (600 mg total) by mouth every 6 (six) hours as needed. 08/07/22   Neldon Hamp RAMAN, PA  methocarbamol  (ROBAXIN ) 500 MG tablet Take 1 tablet (500 mg total) by mouth 2 (two) times daily. 08/07/22   Neldon Hamp RAMAN, PA  Omega-3 Fatty Acids (FISH OIL PO) Take by mouth.    [provider]  Semaglutide ,0.25 or 0.5MG /DOS, (OZEMPIC , 0.25 OR 0.5 MG/DOSE,) 2 MG/1.5ML SOPN 0.5mg  weekly 05/07/21   Nche, Charlotte Lum, NP  Ferrous Sulfate (IRON  PO) Take by mouth.  06/28/19  [provider]    Allergies: Penicillins, Amoxicillin, and Glipizide    Review of Systems Ten systems reviewed and are negative for acute change, except as noted in the HPI.    Updated Vital Signs BP (!) 145/85 (BP Location: Right Arm)   Pulse 84   Temp 98.5 F (36.9 C) (Oral)   Resp 16   SpO2 100%   Physical Exam Vitals and nursing note reviewed.  Constitutional:      General: She is not in acute distress.    Appearance: She is well-developed. She is not diaphoretic.     Comments: Anxious appearing. Depressed mood. Nontoxic.   HENT:     Head: Normocephalic and atraumatic.  Eyes:     General: No scleral icterus.    Conjunctiva/sclera: Conjunctivae normal.  Cardiovascular:     Rate and Rhythm: Normal rate and regular rhythm.     Pulses: Normal pulses.  Pulmonary:     Effort: Pulmonary effort is normal. No respiratory distress.     Breath sounds: No stridor. No wheezing.     Comments: Lungs CTAB. Respirations even and unlabored. Musculoskeletal:        General: Normal range of motion.     Cervical back: Normal range of motion.  Skin:    General: Skin is warm and dry.     Coloration: Skin is not pale.     Findings: No erythema or rash.   Neurological:     Mental Status: She is alert and oriented to person, place, and time.     Coordination: Coordination normal.     Comments: Moving all extremities symmetrically. Ambulatory in the department.  Psychiatric:        Behavior: Behavior normal.     (all labs ordered are listed, but only abnormal results are displayed) Labs Reviewed  CBC - Abnormal; Notable for the following components:      Result Value   MCH 25.3 (*)    All other components within normal limits  BASIC METABOLIC PANEL WITH GFR - Abnormal; Notable for the following components:   Glucose, Bld 166 (*)    All other components within normal limits  CBG MONITORING, ED - Abnormal; Notable for the following components:   Glucose-Capillary 132 (*)    All other components within normal limits  CK    EKG: EKG Interpretation Date/Time:  Monday June 07 2024 05:12:23 EST Ventricular Rate:  78 PR Interval:  136 QRS Duration:  86 QT Interval:  362 QTC Calculation: 413 R Axis:   19  Text Interpretation: Sinus rhythm Low voltage, precordial leads Confirmed by Haze Lonni PARAS [phone]) on 06/08/2024 7:39:57 PM  Radiology: No results found.   Procedures   Medications Ordered in the ED - No data to display                                  Medical Decision Making Amount and/or Complexity of Data Reviewed Labs: ordered. ECG/medicine tests: ordered.   This patient presents to the ED for concern of tremors, this involves an extensive number of treatment options, and is a complaint that carries with it a high risk of complications and morbidity.  The differential diagnosis includes anxiety vs withdrawal vs electrolyte abnormality vs neurologic condition vs rhabdomyolysis   Co morbidities that complicate the patient evaluation  Asthma DM Anemia   Additional history obtained:  External records from outside source obtained and reviewed including prior discharge summaries   Lab Tests:  I  Ordered, and personally interpreted labs.  The pertinent results include:  Glucose 166   Cardiac Monitoring:  The patient was maintained on a cardiac monitor.  I personally viewed and interpreted the cardiac monitored which showed an underlying rhythm of: NSR   Medicines ordered and prescription drug management:  I have reviewed the patients home medicines and have made adjustments as needed   Test Considered:  Ethanol/UDS   Problem List / ED Course:  Patient presenting for generalized weakness with feeling tremulous.  Reports this escalated to a panic attack. Patient is noted to be anxious appearing with a depressed mood.  I suspect she has a  degree of adjustment disorder with anxiety secondary to complications from recent surgery. Her laboratory evaluation is reassuring today.  CBC and BMP are within normal limits.  A CK was also ordered which is negative.  No concern for rhabdomyolysis. No focal neurologic deficits noted on exam.  Doubt emergent neurologic condition.  Have encouraged patient to follow-up with her primary care doctor.  Do not feel further emergent workup is presently indicated.   Reevaluation:  After the interventions noted above, I reevaluated the patient and found that they have :improved   Social Determinants of Health:  Lives independently   Dispostion:  After consideration of the diagnostic results and the patients response to treatment, I feel that the patent would benefit from outpatient PCP f/u. Return precautions discussed and provided. Patient discharged in stable condition with no unaddressed concerns.       Final diagnoses:  Generalized weakness  Adjustment reaction with anxiety    ED Discharge Orders     None          Keith Sor, PA-C 06/19/24 0154    Griselda Norris, MD 06/24/24 1304

## 2024-08-20 ENCOUNTER — Emergency Department (HOSPITAL_COMMUNITY)

## 2024-08-20 ENCOUNTER — Other Ambulatory Visit: Payer: Self-pay

## 2024-08-20 ENCOUNTER — Emergency Department (HOSPITAL_COMMUNITY)
Admission: EM | Admit: 2024-08-20 | Discharge: 2024-08-21 | Disposition: A | Attending: Emergency Medicine | Admitting: Emergency Medicine

## 2024-08-20 ENCOUNTER — Encounter (HOSPITAL_COMMUNITY): Payer: Self-pay

## 2024-08-20 DIAGNOSIS — R002 Palpitations: Secondary | ICD-10-CM | POA: Diagnosis present

## 2024-08-20 DIAGNOSIS — E119 Type 2 diabetes mellitus without complications: Secondary | ICD-10-CM | POA: Insufficient documentation

## 2024-08-20 DIAGNOSIS — J45909 Unspecified asthma, uncomplicated: Secondary | ICD-10-CM | POA: Diagnosis not present

## 2024-08-20 DIAGNOSIS — D219 Benign neoplasm of connective and other soft tissue, unspecified: Secondary | ICD-10-CM | POA: Diagnosis not present

## 2024-08-20 LAB — URINALYSIS, ROUTINE W REFLEX MICROSCOPIC
Bilirubin Urine: NEGATIVE
Glucose, UA: NEGATIVE mg/dL
Hgb urine dipstick: NEGATIVE
Ketones, ur: 5 mg/dL — AB
Leukocytes,Ua: NEGATIVE
Nitrite: NEGATIVE
Protein, ur: NEGATIVE mg/dL
Specific Gravity, Urine: 1.009 (ref 1.005–1.030)
pH: 6 (ref 5.0–8.0)

## 2024-08-20 LAB — LIPASE, BLOOD: Lipase: 33 U/L (ref 11–51)

## 2024-08-20 LAB — COMPREHENSIVE METABOLIC PANEL WITH GFR
ALT: 14 U/L (ref 0–44)
AST: 16 U/L (ref 15–41)
Albumin: 4.1 g/dL (ref 3.5–5.0)
Alkaline Phosphatase: 47 U/L (ref 38–126)
Anion gap: 10 (ref 5–15)
BUN: 14 mg/dL (ref 6–20)
CO2: 23 mmol/L (ref 22–32)
Calcium: 9.1 mg/dL (ref 8.9–10.3)
Chloride: 104 mmol/L (ref 98–111)
Creatinine, Ser: 0.82 mg/dL (ref 0.44–1.00)
GFR, Estimated: >60 mL/min
Glucose, Bld: 221 mg/dL — ABNORMAL HIGH (ref 70–99)
Potassium: 4.3 mmol/L (ref 3.5–5.1)
Sodium: 137 mmol/L (ref 135–145)
Total Bilirubin: 0.2 mg/dL (ref 0.0–1.2)
Total Protein: 7.8 g/dL (ref 6.5–8.1)

## 2024-08-20 LAB — CBC WITH DIFFERENTIAL/PLATELET
Abs Immature Granulocytes: 0.01 10*3/uL (ref 0.00–0.07)
Basophils Absolute: 0 10*3/uL (ref 0.0–0.1)
Basophils Relative: 0 %
Eosinophils Absolute: 0.1 10*3/uL (ref 0.0–0.5)
Eosinophils Relative: 1 %
HCT: 40.7 % (ref 36.0–46.0)
Hemoglobin: 13 g/dL (ref 12.0–15.0)
Immature Granulocytes: 0 %
Lymphocytes Relative: 22 %
Lymphs Abs: 1.2 10*3/uL (ref 0.7–4.0)
MCH: 25.4 pg — ABNORMAL LOW (ref 26.0–34.0)
MCHC: 31.9 g/dL (ref 30.0–36.0)
MCV: 79.6 fL — ABNORMAL LOW (ref 80.0–100.0)
Monocytes Absolute: 0.3 10*3/uL (ref 0.1–1.0)
Monocytes Relative: 6 %
Neutro Abs: 3.8 10*3/uL (ref 1.7–7.7)
Neutrophils Relative %: 71 %
Platelets: 311 10*3/uL (ref 150–400)
RBC: 5.11 MIL/uL (ref 3.87–5.11)
RDW: 13.4 % (ref 11.5–15.5)
WBC: 5.3 10*3/uL (ref 4.0–10.5)
nRBC: 0 % (ref 0.0–0.2)

## 2024-08-20 LAB — HCG, SERUM, QUALITATIVE: Preg, Serum: NEGATIVE

## 2024-08-20 NOTE — ED Triage Notes (Signed)
 PT BIB [facility] from home c/o of palpitation and nausea/vomiting that started an hour ago. Per EMS pt states that her HR was 180 on her watch. Pt states she has no prior hx of heart issues, but states she has had similar episodes of tachycardia in the past several weeks. Pt denies nausea, dizziness, lightheadedness, and chest pain.   EMS vitals  165/80  80 HR  100% sp02 room air    165 cbg   22 g L AC

## 2024-08-20 NOTE — ED Provider Triage Note (Signed)
 Emergency Medicine Provider Triage Evaluation Note  Tricia Benitez , a 39 y.o. female  was evaluated in triage.  Pt complains of palpitations.  Patient states she noticed her heart racing this evening.  It went up as high as the 140s 150s.  That has resolved.  Patient however has also been having some trouble with pain in her lower abdomen and back.  Her doctor thought she might have a kidney stone but she has not had any further evaluation.  She is not having any chest pain or shortness of breath  Review of Systems  Positive: Palpitations Negative: EVAR  Physical Exam  BP 135/86 (BP Location: Right Arm)   Pulse 83   Temp 98.7 F (37.1 C)   Resp 20   Ht 1.651 m (5' 5)   Wt 79.4 kg   SpO2 100%   BMI 29.12 kg/m  Gen:   Awake, no distress   Resp:  Normal effort  MSK:   Moves extremities without difficulty  Other:  Heart regular rate and rhythm Medical Decision Making  Medically screening exam initiated at 9:47 PM.  Appropriate orders placed.  Henry Demeritt was informed that the remainder of the evaluation will be completed by another provider, this initial triage assessment does not replace that evaluation, and the importance of remaining in the ED until their evaluation is complete.     Randol Simmonds, MD 08/20/24 2147

## 2024-08-21 ENCOUNTER — Emergency Department (HOSPITAL_COMMUNITY)

## 2024-08-21 LAB — TSH: TSH: 1.9 u[IU]/mL (ref 0.350–4.500)

## 2024-08-21 NOTE — Discharge Instructions (Addendum)
 You were evaluated in the Emergency Department and after careful evaluation, we did not find any emergent condition requiring admission or further testing in the hospital.  Your exam/testing today is overall reassuring.  Pain likely due to your fibroids.  Follow-up with your OB/GYN.  Regarding your palpitations or elevated heart rate, we recommend follow-up with cardiology.  Cardiology should reach out to you for an appointment.  Please return to the Emergency Department if you experience any worsening of your condition.   Thank you for allowing us  to be a part of your care.

## 2024-08-21 NOTE — ED Provider Notes (Signed)
 " [facility] Emergency Department Provider Note MRN:  [id number]  Arrival date & time: 08/21/24     Chief Complaint   Palpitations (/)   History of Present Illness   Tricia Benitez is a 39 y.o. year-old female with a history of diabetes presenting to the ED with chief complaint of palpitations.  A few episodes of elevated heart rate this week.  2 times it happened while sleeping, suddenly her heart rate in the 150s.  This evening she had some vomiting followed by the palpitations.  Denies any chest pain or shortness of breath.  Has been having some left lower quadrant and left flank pain recently as well.  Review of Systems  A thorough review of systems was obtained and all systems are negative except as noted in the HPI and PMH.   Patient's Health History    Past Medical History:  Diagnosis Date   Anemia    Asthma    Diabetes mellitus without complication (HCC)    Type II   UTI (urinary tract infection)     Past Surgical History:  Procedure Laterality Date   BREAST BIOPSY Left 2015   cyst removed.    MOUTH SURGERY     teeth ext    MYOMECTOMY N/A 09/01/2018   Procedure: ABDOMINAL MYOMECTOMY;  Surgeon: Lorence Ozell CROME, MD;  Location: [facility];  Service: Gynecology;  Laterality: N/A;   TONSILLECTOMY      Family History  Problem Relation Age of Onset   Fibroids Mother    Asthma Mother    Diabetes Father    Kidney disease Father    Alcohol abuse Father    Diabetes Paternal Grandmother    Hyperlipidemia Paternal Grandmother    Hypertension Paternal Grandmother    Alcohol abuse Maternal Grandmother     Social History   Socioeconomic History   Marital status: Single    Spouse name: Not on file   Number of children: 0   Years of education: Not on file   Highest education level: Not on file  Occupational History   Not on file  Tobacco Use   Smoking status: Never   Smokeless tobacco: Never  Vaping Use   Vaping status: Never Used   Substance and Sexual Activity   Alcohol use: Yes    Comment: social   Drug use: Never   Sexual activity: Not Currently    Birth control/protection: Other-see comments    Comment: female partner  Other Topics Concern   Not on file  Social History Narrative   Not on file   Social Drivers of Health   Tobacco Use: Low Risk (08/20/2024)   Patient History    Smoking Tobacco Use: Never    Smokeless Tobacco Use: Never    Passive Exposure: Not on file  Financial Resource Strain: Low Risk (02/17/2024)   Received from [facility]   Financial Resource Strain    How hard is it for you to pay for the very basics like food, housing, medical care, and heating?: Not very hard  Food Insecurity: No Food Insecurity (02/17/2024)   Received from [facility]   Epic    Within the past 12 months, you worried that your food would run out before you got the money to buy more.: Never true    Within the past 12 months, the food you bought just didn't last and you didn't have money to get more.: Never true  Transportation Needs: Unknown (02/17/2024)  Received from [facility]   PRAPARE - Administrator, Civil Service (Medical): Patient declined    Lack of Transportation (Non-Medical): No  Physical Activity: Sufficiently Active (02/17/2024)   Received from [facility]   Exercise Vital Sign    On average, how many days per week do you engage in moderate to strenuous exercise (like a brisk walk)?: 4 days    On average, how many minutes do you engage in exercise at this level?: 60 min  Stress: Stress Concern Present (02/17/2024)   Received from [facility]   [employer] of Occupational Health - Occupational Stress Questionnaire    Feeling of Stress : To some extent  Social Connections: Unknown (02/17/2024)   Received from [facility]   Social Connections    In a typical week, how many times do you talk on the  phone with family, friends, or neighbors?: Twice a week    How often do you get together with friends or relatives?: Once a week    How often do you attend church or religious services?: Never    Do you belong to any clubs or organizations such as church groups, unions, fraternal or athletic groups, or school groups?: No    How often do you attend meetings of the clubs or organizations you belong to?: Never    Are you married, widowed, divorced, separated, never married, or living with a partner?: Patient declined  Intimate Partner Violence: Not on file  Depression (PHQ2-9): Not on file  Alcohol Screen: Not on file  Housing: Not on file  Utilities: Not At Risk (02/17/2024)   Received from [facility]   [employer] Utilities    Threatened with loss of utilities: No  Health Literacy: Adequate Health Literacy (02/17/2024)   Received from [facility]   B1300 Health Literacy    Frequency of need for help with medical instructions: Never     Physical Exam   Vitals:   08/21/24 0403 08/21/24 0700  BP: 121/75 119/76  Pulse: 66 82  Resp: 17 15  Temp: 98.5 F (36.9 C)   SpO2: 100% 100%    CONSTITUTIONAL: Well-appearing, NAD NEURO/PSYCH:  Alert and oriented x 3, no focal deficits EYES:  eyes equal and reactive ENT/NECK:  no LAD, no JVD CARDIO: Regular rate, well-perfused, normal S1 and S2 PULM:  CTAB no wheezing or rhonchi GI/GU:  non-distended, non-tender MSK/SPINE:  No gross deformities, no edema SKIN:  no rash, atraumatic   *Additional and/or pertinent findings included in MDM below  Diagnostic and Interventional Summary    EKG Interpretation Date/Time:  Saturday August 21 2024 05:52:58 EST Ventricular Rate:  80 PR Interval:  145 QRS Duration:  81 QT Interval:  358 QTC Calculation: 413 R Axis:   25  Text Interpretation: Sinus rhythm Low voltage, precordial leads Confirmed by Theadore Sharper [phone]) on 08/21/2024 6:13:26 AM       Labs Reviewed  CBC  WITH DIFFERENTIAL/PLATELET - Abnormal; Notable for the following components:      Result Value   MCV 79.6 (*)    MCH 25.4 (*)    All other components within normal limits  URINALYSIS, ROUTINE W REFLEX MICROSCOPIC - Abnormal; Notable for the following components:   Color, Urine STRAW (*)    Ketones, ur 5 (*)    All other components within normal limits  COMPREHENSIVE METABOLIC PANEL WITH GFR - Abnormal; Notable for the following components:  Glucose, Bld 221 (*)    All other components within normal limits  HCG, SERUM, QUALITATIVE  LIPASE, BLOOD  TSH    DG Chest Port 1 View  Final Result    CT Renal Stone Study  Final Result      Medications - No data to display   Procedures  /  Critical Care Procedures  ED Course and Medical Decision Making  Initial Impression and Ddx Differential diagnosis includes arrhythmia, anemia, electrolyte disturbance, kidney stone, pyelonephritis, diverticulitis.  Past medical/surgical history that increases complexity of ED encounter: Diabetes  Interpretation of Diagnostics I personally reviewed the EKG and my interpretation is as follows: Sinus rhythm  No significant blood count or electrolyte disturbance.  Patient Reassessment and Ultimate Disposition/Management     No signs of emergent process, no ectopy or arrhythmia noted on cardiac monitoring, no evidence of thyroid  disturbance, CT imaging without concerning features.  Plan is for discharge with follow-up, return precautions.  Patient management required discussion with the following services or consulting groups:  None  Complexity of Problems Addressed Acute illness or injury that poses threat of life of bodily function  Additional Data Reviewed and Analyzed Further history obtained from: Prior labs/imaging results  Additional Factors Impacting ED Encounter Risk Consideration of hospitalization  Ozell HERO. Theadore, MD [facility] Emergency Medicine [facility]  Health [email or web address]  Final Clinical Impressions(s) / ED Diagnoses     ICD-10-CM   1. Palpitations  R00.2 Ambulatory referral to Cardiology    2. Fibroids  D21.9       ED Discharge Orders          Ordered    Ambulatory referral to Cardiology        08/21/24 0721             Discharge Instructions Discussed with and Provided to Patient:     Discharge Instructions      You were evaluated in the Emergency Department and after careful evaluation, we did not find any emergent condition requiring admission or further testing in the hospital.  Your exam/testing today is overall reassuring.  Pain likely due to your fibroids.  Follow-up with your OB/GYN.  Regarding your palpitations or elevated heart rate, we recommend follow-up with cardiology.  Cardiology should reach out to you for an appointment.  Please return to the Emergency Department if you experience any worsening of your condition.   Thank you for allowing us  to be a part of your care.       Theadore Ozell HERO, MD 08/21/24 [phone]  "

## 2024-08-26 ENCOUNTER — Ambulatory Visit

## 2024-08-26 ENCOUNTER — Encounter: Payer: Self-pay | Admitting: Student

## 2024-08-26 ENCOUNTER — Ambulatory Visit: Admitting: Student

## 2024-08-26 VITALS — BP 126/74 | HR 76 | Ht 65.0 in | Wt 178.0 lb

## 2024-08-26 DIAGNOSIS — R002 Palpitations: Secondary | ICD-10-CM | POA: Diagnosis not present

## 2024-08-26 DIAGNOSIS — E119 Type 2 diabetes mellitus without complications: Secondary | ICD-10-CM

## 2024-08-26 DIAGNOSIS — Z794 Long term (current) use of insulin: Secondary | ICD-10-CM | POA: Diagnosis not present

## 2024-08-26 NOTE — Patient Instructions (Signed)
 Medication Instructions:  Your physician recommends that you continue on your current medications as directed. Please refer to the Current Medication list given to you today.  *If you need a refill on your cardiac medications before your next appointment, please call your pharmacy*  Lab Work: TODAY:  MAGNESIUM LEVEL  If you have labs (blood work) drawn today and your tests are completely normal, you will receive your results only by: MyChart Message (if you have MyChart) OR A paper copy in the mail If you have any lab test that is abnormal or we need to change your treatment, we will call you to review the results.  Testing/Procedures: GEOFFRY HEWS- Long Term Monitor Instructions  Your physician has requested you wear a ZIO patch monitor for 14 days.  This is a single patch monitor. [employer] supplies one patch monitor per enrollment. Additional stickers are not available. Please do not apply patch if you will be having a Nuclear Stress Test,  Echocardiogram, Cardiac CT, MRI, or Chest Xray during the period you would be wearing the  monitor. The patch cannot be worn during these tests. You cannot remove and re-apply the  ZIO XT patch monitor.  Your ZIO patch monitor will be mailed 3 day USPS to your address on file. It may take 3-5 days  to receive your monitor after you have been enrolled.  Once you have received your monitor, please review the enclosed instructions. Your monitor  has already been registered assigning a specific monitor serial # to you.  Billing and Patient Assistance Program Information  We have supplied [employer] with any of your insurance information on file for billing purposes. [employer] offers a sliding scale Patient Assistance Program for patients that do not have  insurance, or whose insurance does not completely cover the cost of the ZIO monitor.  You must apply for the Patient Assistance Program to qualify for this discounted rate.  To apply, please call [employer] at  [phone], select option 4, select option 2, ask to apply for  Patient Assistance Program. Meredeth will ask your household income, and how many people  are in your household. They will quote your out-of-pocket cost based on that information.  [employer] will also be able to set up a 43-month, interest-free payment plan if needed.  Applying the monitor   Shave hair from upper left chest.  Hold abrader disc by orange tab. Rub abrader in 40 strokes over the upper left chest as  indicated in your monitor instructions.  Clean area with 4 enclosed alcohol pads. Let dry.  Apply patch as indicated in monitor instructions. Patch will be placed under collarbone on left  side of chest with arrow pointing upward.  Rub patch adhesive wings for 2 minutes. Remove white label marked 1. Remove the white  label marked 2. Rub patch adhesive wings for 2 additional minutes.  While looking in a mirror, press and release button in center of patch. A small green light will  flash 3-4 times. This will be your only indicator that the monitor has been turned on.  Do not shower for the first 24 hours. You may shower after the first 24 hours.  Press the button if you feel a symptom. You will hear a small click. Record Date, Time and  Symptom in the Patient Logbook.  When you are ready to remove the patch, follow instructions on the last 2 pages of Patient  Logbook. Stick patch monitor onto the last page of Patient Logbook.  Place Patient  Logbook in the blue and white box. Use locking tab on box and tape box closed  securely. The blue and white box has prepaid postage on it. Please place it in the mailbox as  soon as possible. Your physician should have your test results approximately 7 days after the  monitor has been mailed back to [facility].  Call [facility] Customer Care at [phone] if you have questions regarding  your ZIO XT patch monitor. Call them immediately if you see an orange light  blinking on your  monitor.  If your monitor falls off in less than 4 days, contact our Monitor department at [phone].  If your monitor becomes loose or falls off after 4 days call [employer] at [phone] for  suggestions on securing your monitor   Follow-Up: At [facility], you and your health needs are our priority.  As part of our continuing mission to provide you with exceptional heart care, our providers are all part of one team.  This team includes your primary Cardiologist (physician) and Advanced Practice Providers or APPs (Physician Assistants and Nurse Practitioners) who all work together to provide you with the care you need, when you need it.  Your next appointment:   TO BE DETERMINED   Provider:   Callie Goodrich, PA-C          We recommend signing up for the patient portal called MyChart.  Sign up information is provided on this After Visit Summary.  MyChart is used to connect with patients for Virtual Visits (Telemedicine).  Patients are able to view lab/test results, encounter notes, upcoming appointments, etc.  Non-urgent messages can be sent to your provider as well.   To learn more about what you can do with MyChart, go to [email or web address].   Other Instructions

## 2024-08-26 NOTE — Progress Notes (Unsigned)
 EP to read.

## 2024-08-27 ENCOUNTER — Ambulatory Visit: Payer: Self-pay | Admitting: Student

## 2024-08-27 LAB — MAGNESIUM: Magnesium: 1.8 mg/dL (ref 1.6–2.3)
